# Patient Record
Sex: Female | Born: 1980 | Race: Black or African American | Hispanic: No | Marital: Single | Smoking: Never smoker
Health system: Southern US, Community
[De-identification: ages and names within clinical notes are randomized; demographics above are authoritative.]

## PROBLEM LIST (undated history)

## (undated) ENCOUNTER — Emergency Department (HOSPITAL_COMMUNITY): Admission: EM | Payer: BC Managed Care – PPO | Source: Home / Self Care

## (undated) DIAGNOSIS — T7840XA Allergy, unspecified, initial encounter: Secondary | ICD-10-CM

## (undated) DIAGNOSIS — I10 Essential (primary) hypertension: Secondary | ICD-10-CM

## (undated) DIAGNOSIS — R569 Unspecified convulsions: Secondary | ICD-10-CM

## (undated) DIAGNOSIS — D259 Leiomyoma of uterus, unspecified: Secondary | ICD-10-CM

## (undated) DIAGNOSIS — M5030 Other cervical disc degeneration, unspecified cervical region: Secondary | ICD-10-CM

## (undated) HISTORY — DX: Allergy, unspecified, initial encounter: T78.40XA

## (undated) HISTORY — DX: Unspecified convulsions: R56.9

## (undated) HISTORY — DX: Essential (primary) hypertension: I10

---

## 2003-07-31 HISTORY — PX: APPENDECTOMY: SHX54

## 2007-06-20 ENCOUNTER — Inpatient Hospital Stay (HOSPITAL_COMMUNITY): Admission: AD | Admit: 2007-06-20 | Discharge: 2007-06-20 | Payer: Self-pay | Admitting: Obstetrics and Gynecology

## 2007-07-15 ENCOUNTER — Emergency Department (HOSPITAL_COMMUNITY): Admission: EM | Admit: 2007-07-15 | Discharge: 2007-07-15 | Payer: Self-pay | Admitting: Emergency Medicine

## 2008-10-10 ENCOUNTER — Emergency Department (HOSPITAL_COMMUNITY): Admission: EM | Admit: 2008-10-10 | Discharge: 2008-10-10 | Payer: Self-pay | Admitting: Emergency Medicine

## 2008-12-28 ENCOUNTER — Inpatient Hospital Stay (HOSPITAL_COMMUNITY): Admission: AD | Admit: 2008-12-28 | Discharge: 2008-12-29 | Payer: Self-pay | Admitting: Obstetrics and Gynecology

## 2008-12-29 ENCOUNTER — Inpatient Hospital Stay (HOSPITAL_COMMUNITY): Admission: AD | Admit: 2008-12-29 | Discharge: 2009-01-02 | Payer: Self-pay | Admitting: Obstetrics and Gynecology

## 2010-06-19 ENCOUNTER — Ambulatory Visit (HOSPITAL_BASED_OUTPATIENT_CLINIC_OR_DEPARTMENT_OTHER): Admission: RE | Admit: 2010-06-19 | Discharge: 2010-06-19 | Payer: Self-pay | Admitting: Otolaryngology

## 2010-06-26 ENCOUNTER — Emergency Department (HOSPITAL_COMMUNITY): Admission: EM | Admit: 2010-06-26 | Discharge: 2010-06-27 | Payer: Self-pay | Admitting: Emergency Medicine

## 2010-10-10 LAB — URINALYSIS, ROUTINE W REFLEX MICROSCOPIC
Bilirubin Urine: NEGATIVE
Ketones, ur: NEGATIVE mg/dL
Nitrite: NEGATIVE
Specific Gravity, Urine: 1.018 (ref 1.005–1.030)
Urobilinogen, UA: 0.2 mg/dL (ref 0.0–1.0)

## 2010-10-10 LAB — DIFFERENTIAL
Basophils Absolute: 0 10*3/uL (ref 0.0–0.1)
Eosinophils Relative: 4 % (ref 0–5)
Lymphocytes Relative: 24 % (ref 12–46)
Neutro Abs: 8.2 10*3/uL — ABNORMAL HIGH (ref 1.7–7.7)
Neutrophils Relative %: 62 % (ref 43–77)

## 2010-10-10 LAB — BASIC METABOLIC PANEL
BUN: 7 mg/dL (ref 6–23)
Calcium: 9.3 mg/dL (ref 8.4–10.5)
Creatinine, Ser: 0.66 mg/dL (ref 0.4–1.2)
GFR calc Af Amer: >60 mL/min (ref >60)
GFR calc non Af Amer: >60 mL/min (ref >60)

## 2010-10-10 LAB — CBC
MCV: 95.9 fL (ref 78.0–100.0)
Platelets: 270 10*3/uL (ref 150–400)
RBC: 4.16 MIL/uL (ref 3.87–5.11)
RDW: 12.3 % (ref 11.5–15.5)
WBC: 13.1 10*3/uL — ABNORMAL HIGH (ref 4.0–10.5)

## 2010-10-10 LAB — POCT PREGNANCY, URINE: Preg Test, Ur: NEGATIVE

## 2010-11-06 LAB — URINALYSIS, ROUTINE W REFLEX MICROSCOPIC
Bilirubin Urine: NEGATIVE
Ketones, ur: NEGATIVE mg/dL
Nitrite: NEGATIVE
pH: 6 (ref 5.0–8.0)

## 2010-11-06 LAB — COMPREHENSIVE METABOLIC PANEL
ALT: 11 U/L (ref 0–35)
ALT: 13 U/L (ref 0–35)
AST: 19 U/L (ref 0–37)
Albumin: 2.3 g/dL — ABNORMAL LOW (ref 3.5–5.2)
Albumin: 2.5 g/dL — ABNORMAL LOW (ref 3.5–5.2)
Albumin: 2.7 g/dL — ABNORMAL LOW (ref 3.5–5.2)
Albumin: 2.9 g/dL — ABNORMAL LOW (ref 3.5–5.2)
Alkaline Phosphatase: 102 U/L (ref 39–117)
Alkaline Phosphatase: 106 U/L (ref 39–117)
Alkaline Phosphatase: 108 U/L (ref 39–117)
BUN: 1 mg/dL — ABNORMAL LOW (ref 6–23)
BUN: 5 mg/dL — ABNORMAL LOW (ref 6–23)
BUN: 5 mg/dL — ABNORMAL LOW (ref 6–23)
BUN: 6 mg/dL (ref 6–23)
CO2: 22 mEq/L (ref 19–32)
CO2: 25 mEq/L (ref 19–32)
Chloride: 105 mEq/L (ref 96–112)
Chloride: 107 mEq/L (ref 96–112)
Chloride: 107 mEq/L (ref 96–112)
Chloride: 107 mEq/L (ref 96–112)
Chloride: 108 mEq/L (ref 96–112)
Creatinine, Ser: 0.5 mg/dL (ref 0.4–1.2)
Creatinine, Ser: 0.54 mg/dL (ref 0.4–1.2)
Creatinine, Ser: 0.58 mg/dL (ref 0.4–1.2)
Creatinine, Ser: 0.75 mg/dL (ref 0.4–1.2)
GFR calc Af Amer: >60 mL/min (ref >60)
GFR calc non Af Amer: >60 mL/min (ref >60)
GFR calc non Af Amer: >60 mL/min (ref >60)
GFR calc non Af Amer: >60 mL/min (ref >60)
Glucose, Bld: 73 mg/dL (ref 70–99)
Glucose, Bld: 87 mg/dL (ref 70–99)
Glucose, Bld: 99 mg/dL (ref 70–99)
Potassium: 3.5 mEq/L (ref 3.5–5.1)
Potassium: 3.7 mEq/L (ref 3.5–5.1)
Potassium: 3.9 mEq/L (ref 3.5–5.1)
Potassium: 4 mEq/L (ref 3.5–5.1)
Sodium: 135 mEq/L (ref 135–145)
Sodium: 135 mEq/L (ref 135–145)
Total Bilirubin: 0.4 mg/dL (ref 0.3–1.2)
Total Bilirubin: 0.5 mg/dL (ref 0.3–1.2)
Total Bilirubin: 0.5 mg/dL (ref 0.3–1.2)
Total Bilirubin: 0.5 mg/dL (ref 0.3–1.2)
Total Bilirubin: 0.6 mg/dL (ref 0.3–1.2)
Total Protein: 6.1 g/dL (ref 6.0–8.3)

## 2010-11-06 LAB — CBC
HCT: 34.7 % — ABNORMAL LOW (ref 36.0–46.0)
HCT: 34.8 % — ABNORMAL LOW (ref 36.0–46.0)
HCT: 36.3 % (ref 36.0–46.0)
Hemoglobin: 12.3 g/dL (ref 12.0–15.0)
Hemoglobin: 12.8 g/dL (ref 12.0–15.0)
Hemoglobin: 12.9 g/dL (ref 12.0–15.0)
MCHC: 34.7 g/dL (ref 30.0–36.0)
MCHC: 35 g/dL (ref 30.0–36.0)
MCHC: 35.3 g/dL (ref 30.0–36.0)
MCV: 100.4 fL — ABNORMAL HIGH (ref 78.0–100.0)
MCV: 98.6 fL (ref 78.0–100.0)
MCV: 99.9 fL (ref 78.0–100.0)
Platelets: 158 10*3/uL (ref 150–400)
Platelets: 172 10*3/uL (ref 150–400)
Platelets: 194 10*3/uL (ref 150–400)
RBC: 3.62 MIL/uL — ABNORMAL LOW (ref 3.87–5.11)
RBC: 3.65 MIL/uL — ABNORMAL LOW (ref 3.87–5.11)
RBC: 3.68 MIL/uL — ABNORMAL LOW (ref 3.87–5.11)
WBC: 11.2 10*3/uL — ABNORMAL HIGH (ref 4.0–10.5)
WBC: 12.8 10*3/uL — ABNORMAL HIGH (ref 4.0–10.5)
WBC: 12.8 10*3/uL — ABNORMAL HIGH (ref 4.0–10.5)
WBC: 13.1 10*3/uL — ABNORMAL HIGH (ref 4.0–10.5)
WBC: 13.4 10*3/uL — ABNORMAL HIGH (ref 4.0–10.5)
WBC: 15.9 10*3/uL — ABNORMAL HIGH (ref 4.0–10.5)

## 2010-11-06 LAB — URINE CULTURE
Colony Count: NO GROWTH
Special Requests: NEGATIVE

## 2010-11-06 LAB — PROTEIN, URINE, 24 HOUR
Collection Interval-UPROT: 24 hours
Protein, 24H Urine: 374 mg/d — ABNORMAL HIGH (ref 50–100)
Protein, Urine: 44 mg/dL

## 2010-11-06 LAB — CREATININE CLEARANCE, URINE, 24 HOUR
Creatinine Clearance: 125 mL/min — ABNORMAL HIGH (ref 75–115)
Creatinine, 24H Ur: 1114 mg/d (ref 700–1800)

## 2010-11-06 LAB — URIC ACID
Uric Acid, Serum: 5.1 mg/dL (ref 2.4–7.0)
Uric Acid, Serum: 5.2 mg/dL (ref 2.4–7.0)
Uric Acid, Serum: 5.3 mg/dL (ref 2.4–7.0)
Uric Acid, Serum: 5.5 mg/dL (ref 2.4–7.0)
Uric Acid, Serum: 5.9 mg/dL (ref 2.4–7.0)

## 2010-11-06 LAB — URINE MICROSCOPIC-ADD ON

## 2010-11-06 LAB — LACTATE DEHYDROGENASE: LDH: 97 U/L (ref 94–250)

## 2010-11-06 LAB — RPR: RPR Ser Ql: NONREACTIVE

## 2010-11-06 LAB — MAGNESIUM
Magnesium: 3.1 mg/dL — ABNORMAL HIGH (ref 1.5–2.5)
Magnesium: 5.1 mg/dL — ABNORMAL HIGH (ref 1.5–2.5)

## 2011-05-08 LAB — URINE MICROSCOPIC-ADD ON

## 2011-05-08 LAB — HCG, QUANTITATIVE, PREGNANCY: hCG, Beta Chain, Quant, S: <2

## 2011-05-08 LAB — URINALYSIS, ROUTINE W REFLEX MICROSCOPIC
Leukocytes, UA: NEGATIVE
Nitrite: NEGATIVE
Specific Gravity, Urine: 1.02
pH: 6.5

## 2011-05-08 LAB — WET PREP, GENITAL

## 2011-05-08 LAB — CBC
MCHC: 34.5
MCV: 95.3
Platelets: 267

## 2011-08-19 IMAGING — CT CT ABD-PELV W/ CM
2 of 4 series · 17 of 46 positions shown, 19 images · IV contrast (agent unspecified)
Comparison: None.

CLINICAL DATA: Rectal and back pain.  Elevated white blood cell
count.  Prior appendectomy.

CT ABDOMEN AND PELVIS WITH CONTRAST
TECHNIQUE: Multidetector CT imaging of the abdomen and pelvis was
performed following the standard protocol during bolus
administration of intravenous contrast.
Contrast: 100  ml 0mnipaque-WLL

[Series 2: rtn ap with st · axial · 0.60mm/px · z∈[+652,+1042]mm · 14 of 86 slices shown, 16 images]
[im 4/86  soft-tissue]
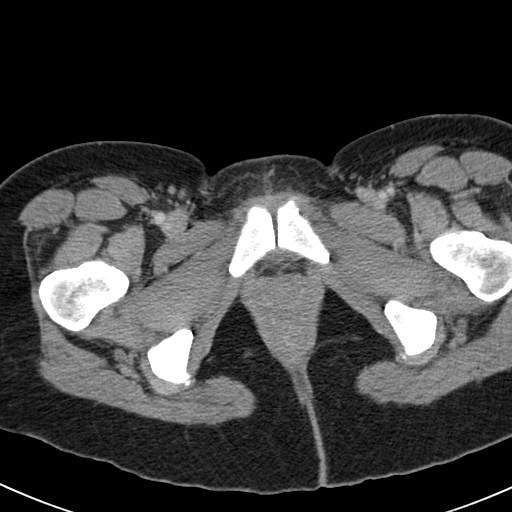
[im 4/86  bone]
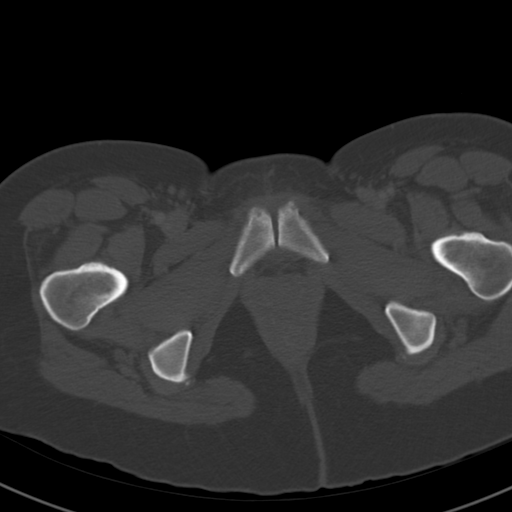
[im 10/86  soft-tissue]
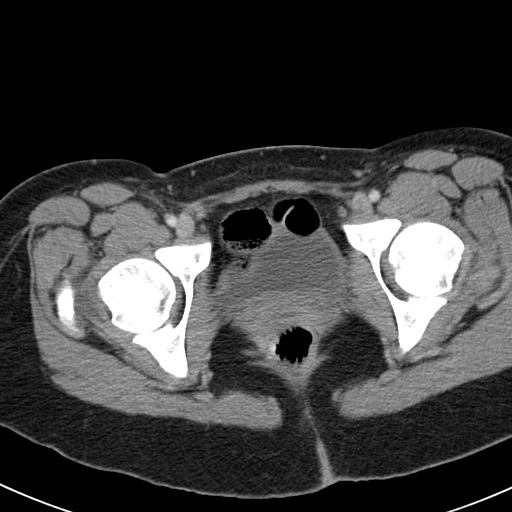
[im 16/86  soft-tissue]
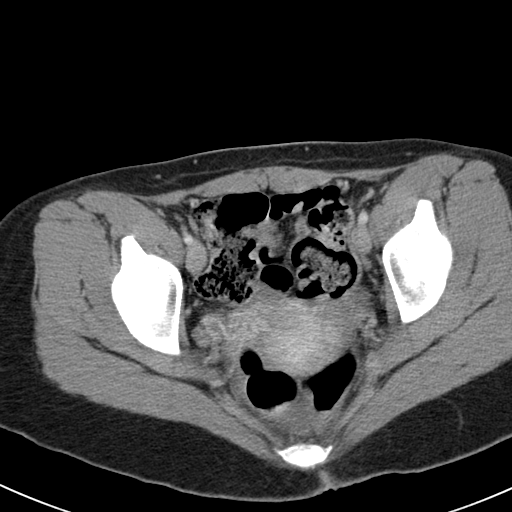
[im 23/86  soft-tissue]
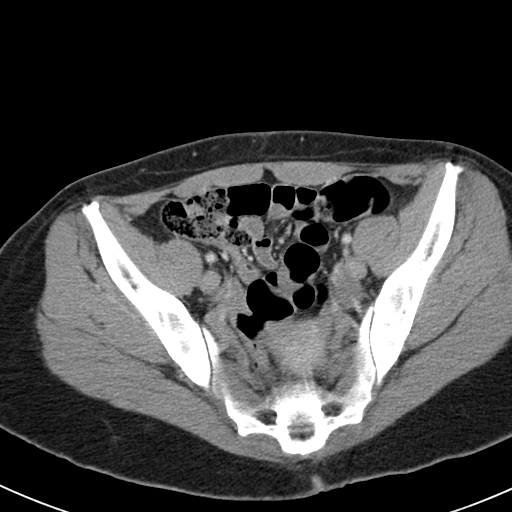
[im 29/86  soft-tissue]
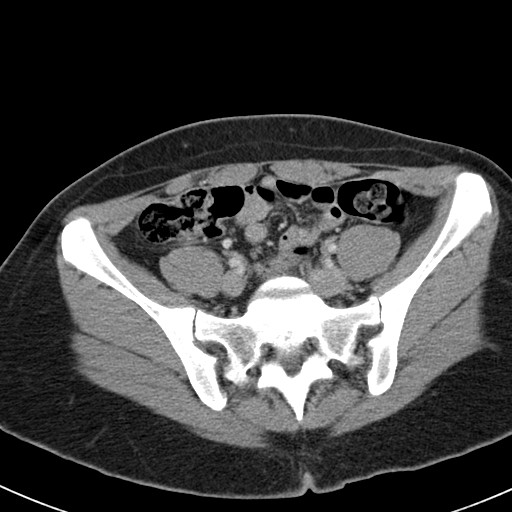
[im 35/86  soft-tissue]
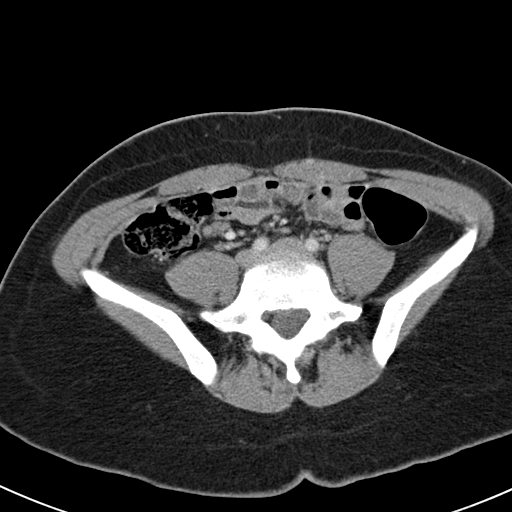
[im 41/86  soft-tissue]
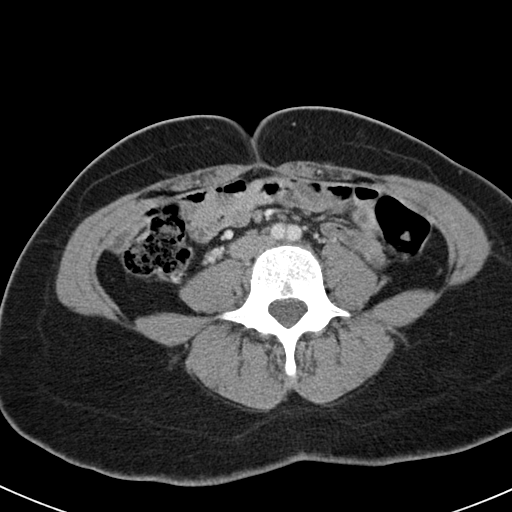
[im 45/86  soft-tissue]
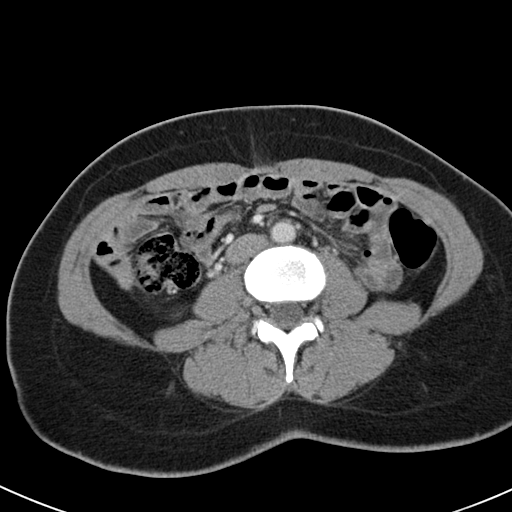
[im 51/86  soft-tissue]
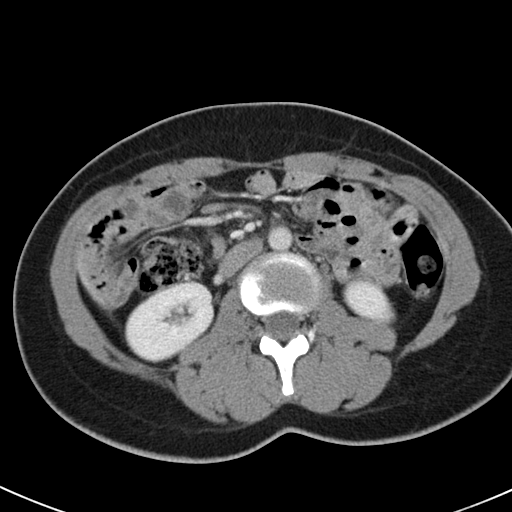
[im 51/86  bone]
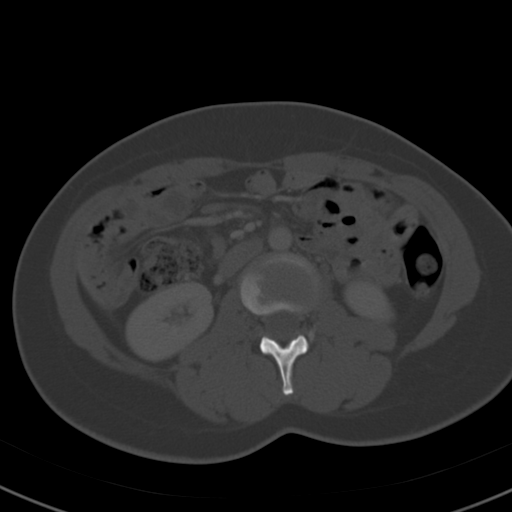
[im 57/86  soft-tissue]
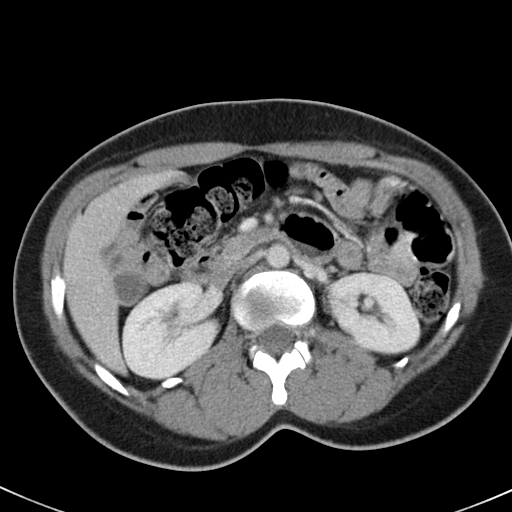
[im 63/86  soft-tissue]
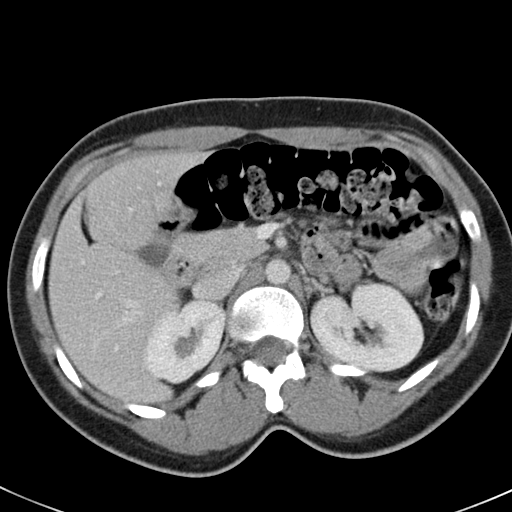
[im 70/86  soft-tissue]
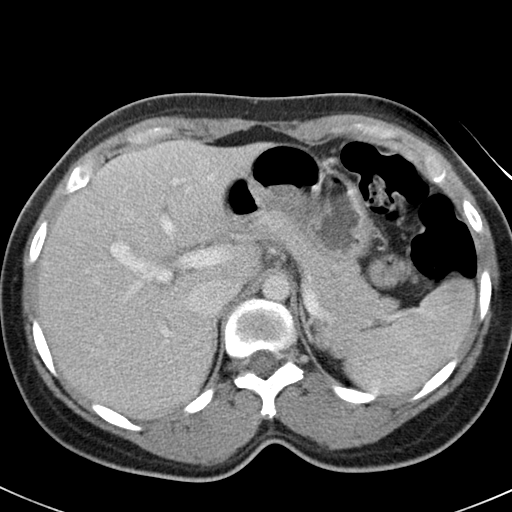
[im 76/86  soft-tissue]
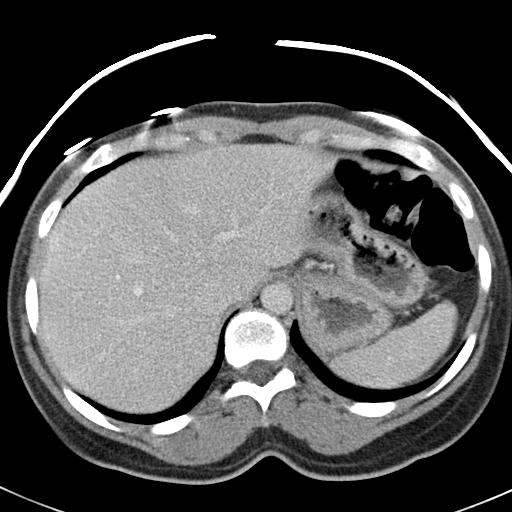
[im 82/86  soft-tissue]
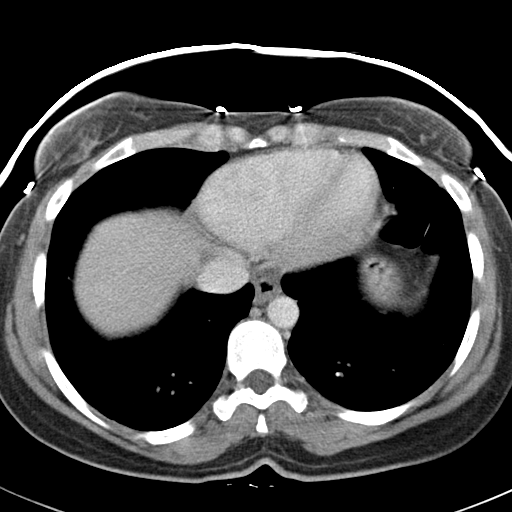

[Series 602: <mpr thick range> · coronal · 0.87mm/px · 3 of 67 slices shown]
[im 23/67  soft-tissue]
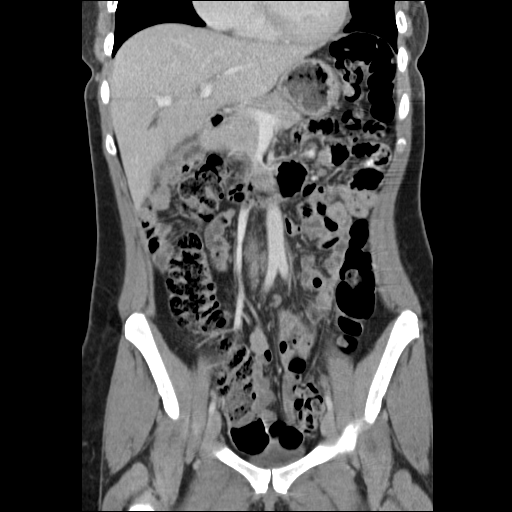
[im 30/67  soft-tissue]
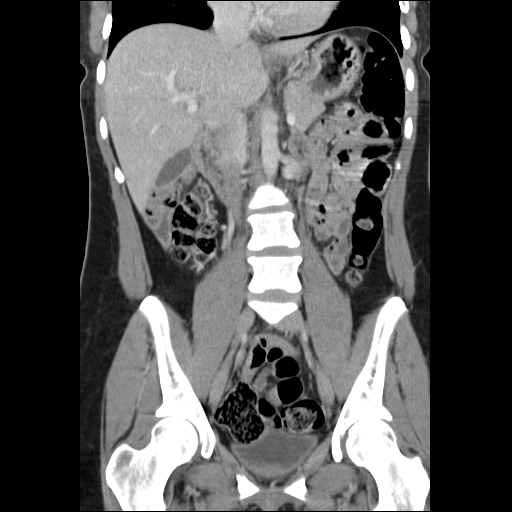
[im 37/67  soft-tissue]
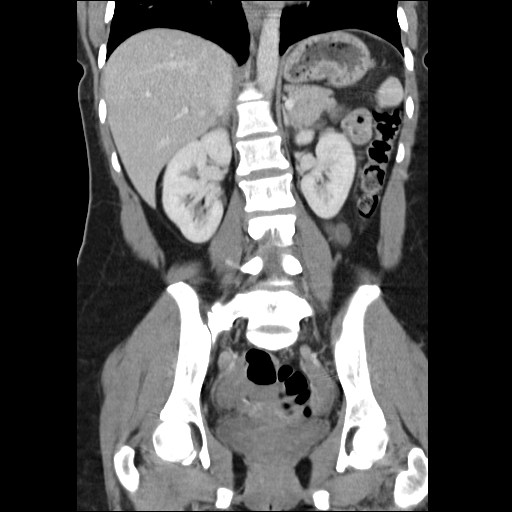

[17 of 46 positions shown; findings below may reference images not displayed]

FINDINGS: Clear lung bases.  Normal heart size without pericardial
or pleural effusion.  Normal liver, spleen, stomach, pancreas,
gallbladder, biliary tract, adrenal glands, left kidney.  Minimal
right-sided caliectasis, including on coronal image 43.  No cause
identified. No retroperitoneal or retrocrural adenopathy.

There is moderate stool within the sigmoid colon.  This could
represent constipation.  Normal terminal ileum.  Appendectomy.
Normal small bowel without abdominal ascites.

  No pelvic adenopathy.  Normal urinary bladder.  Sub cm
peripherally enhancing right ovarian lesion on image 68 could
represent a corpus luteal cyst.  No left ovarian mass. Trace free
pelvic fluid is likely physiologic.  Uterus is retroverted.

No acute osseous abnormality.  Partially sacralized right L5
vertebral body.
IMPRESSION: 1. No acute process in the abdomen or pelvis.
2.  Possible constipation.
3.  Right ovarian corpus luteal cyst suspected.
4.  Minimal right-sided caliectasis without cause identified.

## 2012-06-12 ENCOUNTER — Ambulatory Visit (INDEPENDENT_AMBULATORY_CARE_PROVIDER_SITE_OTHER): Payer: BC Managed Care – PPO | Admitting: Emergency Medicine

## 2012-06-12 VITALS — BP 120/64 | HR 64 | Temp 98.2°F | Resp 18 | Ht 67.0 in | Wt 161.0 lb

## 2012-06-12 DIAGNOSIS — IMO0002 Reserved for concepts with insufficient information to code with codable children: Secondary | ICD-10-CM

## 2012-06-12 DIAGNOSIS — G5680 Other specified mononeuropathies of unspecified upper limb: Secondary | ICD-10-CM

## 2012-06-12 MED ORDER — NAPROXEN SODIUM 550 MG PO TABS: 550.0000 mg | ORAL_TABLET | Freq: Two times a day (BID) | ORAL | Status: DC

## 2012-06-12 MED ORDER — CYCLOBENZAPRINE HCL 10 MG PO TABS: 10.0000 mg | ORAL_TABLET | Freq: Three times a day (TID) | ORAL | Status: DC | PRN

## 2012-06-12 MED ORDER — HYDROCODONE-ACETAMINOPHEN 5-325 MG PO TABS: 1.0000 | ORAL_TABLET | ORAL | Status: AC | PRN

## 2012-06-12 NOTE — Progress Notes (Signed)
[facility] [address] [place] [address] [phone]  Date:  06/12/2012   Name:  Brenda Barker   DOB:  1980/09/17   MRN:  [id number]  PCP:  Serita Kyle, MD    Chief Complaint: Otalgia, Headache and Neck Pain   History of Present Illness:  Brenda Barker is a 31 y.o. very pleasant female patient who presents with the following:  Started with pain in neck and head on Sunday (one week ago).  Now has pain in right side of face and ear.  No fever or chills.  No nasal drainage.  No cough.  No rash or blister  No history of injury or antecedent illness.  No history of prior neck problems.  No neuro or visual symptoms  There is no problem list on file for this patient.   History reviewed. No pertinent past medical history.  Past Surgical History  Procedure Date  . Appendectomy     History  Substance Use Topics  . Smoking status: Never Smoker   . Smokeless tobacco: Not on file  . Alcohol Use: 0.6 oz/week    1 Glasses of wine per week    Family History  Problem Relation Age of Onset  . Asthma Mother     Allergies not on file  Medication list has been reviewed and updated.  No current outpatient prescriptions on file prior to visit.    Review of Systems:  As per HPI, otherwise negative.    Physical Examination: Filed Vitals:   06/12/12 1218  BP: 120/64  Pulse: 64  Temp: 98.2 F (36.8 C)  Resp: 18   Filed Vitals:   06/12/12 1218  Height: 5\' 7"  (1.702 m)  Weight: 161 lb (73.029 kg)   Body mass index is 25.22 kg/(m^2). Ideal Body Weight: Weight in (lb) to have BMI = 25: 159.3   GEN: WDWN, NAD, Non-toxic, A & O x 3  No sepsis or rash.  Won't turn head HEENT: Atraumatic, Normocephalic. Neck supple. No masses, No LAD.  Oropharynx negative Ears and Nose: No external deformity.  TM negative NECK:  Marked tenderness right trapezius with trigger point.  Neuro grossly intact CV: RRR, No M/G/R. No JVD. No thrill. No extra  heart sounds. PULM: CTA B, no wheezes, crackles, rhonchi. No retractions. No resp. distress. No accessory muscle use. ABD: S, NT, ND, +BS. No rebound. No HSM. EXTR: No c/c/e NEURO Normal gait.  PSYCH: Normally interactive. Conversant. Not depressed or anxious appearing.  Calm demeanor.    Assessment and Plan: Scapulocostal syndrome vicodin Anaprox Flexeril Local heat Follow up as needed Offered trigger point injection but declined.  Carmelina Dane, MD

## 2012-06-12 NOTE — Progress Notes (Signed)
Reviewed and agree.

## 2013-01-07 ENCOUNTER — Other Ambulatory Visit: Payer: Self-pay

## 2013-01-07 ENCOUNTER — Ambulatory Visit (INDEPENDENT_AMBULATORY_CARE_PROVIDER_SITE_OTHER): Payer: BC Managed Care – PPO | Admitting: Family Medicine

## 2013-01-07 VITALS — BP 110/81 | HR 79 | Temp 98.4°F | Resp 18 | Wt 167.0 lb

## 2013-01-07 DIAGNOSIS — R05 Cough: Secondary | ICD-10-CM

## 2013-01-07 DIAGNOSIS — J45901 Unspecified asthma with (acute) exacerbation: Secondary | ICD-10-CM

## 2013-01-07 DIAGNOSIS — R059 Cough, unspecified: Secondary | ICD-10-CM

## 2013-01-07 DIAGNOSIS — J4541 Moderate persistent asthma with (acute) exacerbation: Secondary | ICD-10-CM

## 2013-01-07 MED ORDER — ALBUTEROL SULFATE (2.5 MG/3ML) 0.083% IN NEBU: 2.5000 mg | INHALATION_SOLUTION | Freq: Four times a day (QID) | RESPIRATORY_TRACT | Status: DC | PRN

## 2013-01-07 MED ORDER — HYDROCODONE-HOMATROPINE 5-1.5 MG/5ML PO SYRP: 5.0000 mL | ORAL_SOLUTION | ORAL | Status: DC | PRN

## 2013-01-07 MED ORDER — BENZONATATE 100 MG PO CAPS: ORAL_CAPSULE | ORAL | Status: DC

## 2013-01-07 MED ORDER — ALBUTEROL SULFATE (2.5 MG/3ML) 0.083% IN NEBU
2.5000 mg | INHALATION_SOLUTION | Freq: Once | RESPIRATORY_TRACT | Status: AC
  Administered 2013-01-07: 2.5 mg via RESPIRATORY_TRACT

## 2013-01-07 MED ORDER — ALBUTEROL SULFATE HFA 108 (90 BASE) MCG/ACT IN AERS: INHALATION_SPRAY | RESPIRATORY_TRACT | Status: DC

## 2013-01-07 NOTE — Patient Instructions (Signed)
Drink plenty of fluids  Get enough rest  Take the cough syrup when you're not working every 4-6 hours.  Use the cough pills when necessary for coughing in you cannot afford to be too drowsy. They are not as strong as the cough syrup.  Use the inhaler 2 puffs separated by 1-2 minutes every 4-6 hours as needed  If you're not improving by this time tomorrow I may want to prescribe some prednisone for you.

## 2013-01-07 NOTE — Progress Notes (Signed)
Subjective:  32 year old lady who is here with a cough. This began yesterday morning primarily. She only was able to work half a yesterday and again today. She does not smoke. She does not use regular medications .  she has a daughter who has not been ill. She has had a barky incessant cough. She coughs enough her throat is a little sore. She does get lightheaded at times with coughing. Has not been running a fever. Her body aches.  Objective: Healthy appearing lady who looks like she does not feel well. Her TMs are normal. Throat clear. Neck supple without nodes thyromegaly. No carotid bruits. Chest clear to auscultation. On forced expiration she does have a mild end expiratory wheeze and it starts up the barky cough. Her heart was regular without murmurs gallops or arrhythmias.  Assessment:  Asthmatic bronchitis, almost sounds like a child's croup.  Plan: Albuterol nebulizer and then decide treatment. Minimal improvement. Will give her a prescription for albuterol. Also give Tessalon and Hycodan. If she's not doing better by tomorrow I will probably call in some prednisone.

## 2013-07-06 ENCOUNTER — Ambulatory Visit (INDEPENDENT_AMBULATORY_CARE_PROVIDER_SITE_OTHER): Payer: BC Managed Care – PPO | Admitting: Family Medicine

## 2013-07-06 ENCOUNTER — Ambulatory Visit: Payer: BC Managed Care – PPO

## 2013-07-06 VITALS — BP 122/80 | HR 45 | Temp 98.4°F | Resp 12 | Ht 67.0 in | Wt 169.0 lb

## 2013-07-06 DIAGNOSIS — M940 Chondrocostal junction syndrome [Tietze]: Secondary | ICD-10-CM

## 2013-07-06 DIAGNOSIS — R079 Chest pain, unspecified: Secondary | ICD-10-CM

## 2013-07-06 LAB — POCT CBC
Lymph, poc: 3.4 (ref 0.6–3.4)
MCH, POC: 31.3 pg — AB (ref 27–31.2)
MCHC: 30.6 g/dL — AB (ref 31.8–35.4)
MID (cbc): 1 — AB (ref 0–0.9)
MPV: 8.1 fL (ref 0–99.8)
POC MID %: 9.3 %M (ref 0–12)
Platelet Count, POC: 235 10*3/uL (ref 142–424)
RBC: 3.74 M/uL — AB (ref 4.04–5.48)
RDW, POC: 13 %
WBC: 10.6 10*3/uL — AB (ref 4.6–10.2)

## 2013-07-06 MED ORDER — PREDNISONE 20 MG PO TABS: ORAL_TABLET | ORAL | Status: DC

## 2013-07-06 MED ORDER — OMEPRAZOLE 40 MG PO CPDR: 40.0000 mg | DELAYED_RELEASE_CAPSULE | Freq: Every day | ORAL | Status: DC

## 2013-07-06 NOTE — Patient Instructions (Signed)
Continue to use the hydrocodone syrup been having a lot of pain.  Can also continue using the indomethacin if needed for pain. Make sure you take it with some food and do not lie down immediately afterwards. Give it about 30-60 minutes before lying down.  Take the prednisone 3 daily for 2 days, then 2 daily for 2 days, then one daily for 2 days, then one half daily for 4 days. This is best taken with food in the morning  Take omeprazole 1 daily for esophageal irritation by stomach acids.  If worse at any time please return or go to the emergency room. If you're not improving over the next 7-10 days come back for a recheck.

## 2013-07-06 NOTE — Progress Notes (Signed)
Subjective: 32 year old lady who has been having chest pain for the last 3 weeks. She went to a doctor in another town at an urgent care. She was evaluated with an EKG which showed a slow heart rate, but was otherwise normal. She was treated for chest wall pain with some hydrocodone syrup, and indomethacin, and an antibiotic. She does not smoke. She is single, has 2 children. sHe was hurting more this morning when she was getting her daughter ready for school. When she lies down it seems to hurt more. She denies any reflux. When she is real active it sometimes hurts but also.  She is not on any other regular medications. She does have a NuvaRing.  Objective: Pleasant lady in no major distress though she says the pain is about a 7/10. She keeps rubbing her chest wall and around underneath her right breast. Her throat is clear. Neck supple without significant nodes. Chest is clear to auscultation. Chest is painful on palpation of the anterior chest sternal area and around underneath the right breast. The sides of the chest do not seem to be particularly tender. Heart was regular, slightly bradycardic, no murmurs gallops or arrhythmias. Abdomen soft.  Assessment: Probable costochondritis  Plan: Will get a chest x-ray CBC and sedimentation rate.  [facility] reading (PRIMARY) by  Dr. Alwyn Ren Normal chest x-ray  Results for orders placed in visit on 07/06/13  POCT CBC      Result Value Range   WBC 10.6 (*) 4.6 - 10.2 K/uL   Lymph, poc 3.4  0.6 - 3.4   POC LYMPH PERCENT 32.4  10 - 50 %L   MID (cbc) 1.0 (*) 0 - 0.9   POC MID % 9.3  0 - 12 %M   POC Granulocyte 6.2  2 - 6.9   Granulocyte percent 58.3  37 - 80 %G   RBC 3.74 (*) 4.04 - 5.48 M/uL   Hemoglobin 11.7 (*) 12.2 - 16.2 g/dL   HCT, POC 14.7  82.9 - 47.9 %   MCV 102.1 (*) 80 - 97 fL   MCH, POC 31.3 (*) 27 - 31.2 pg   MCHC 30.6 (*) 31.8 - 35.4 g/dL   RDW, POC 56.2     Platelet Count, POC 235  142 - 424 K/uL   MPV 8.1  0 - 99.8 fL   .  This  is most consistent with costochondritis. Will treat with steroids. Will also give her some omeprazole to decrease stomach acid production. She can continue using her hydrocodone if needed and the indomethacin if needed, but cautioned about refluxing the indomethacin.  Return if worse

## 2014-11-13 ENCOUNTER — Emergency Department (HOSPITAL_COMMUNITY): Payer: BC Managed Care – PPO

## 2014-11-13 ENCOUNTER — Emergency Department (HOSPITAL_COMMUNITY)
Admission: EM | Admit: 2014-11-13 | Discharge: 2014-11-14 | Disposition: A | Discharge status: Home / Self Care | Payer: BC Managed Care – PPO | Attending: Emergency Medicine | Admitting: Emergency Medicine

## 2014-11-13 ENCOUNTER — Encounter (HOSPITAL_COMMUNITY): Payer: Self-pay | Admitting: *Deleted

## 2014-11-13 DIAGNOSIS — Y92002 Bathroom of unspecified non-institutional (private) residence single-family (private) house as the place of occurrence of the external cause: Secondary | ICD-10-CM | POA: Diagnosis not present

## 2014-11-13 DIAGNOSIS — Y9339 Activity, other involving climbing, rappelling and jumping off: Secondary | ICD-10-CM | POA: Insufficient documentation

## 2014-11-13 DIAGNOSIS — S8991XA Unspecified injury of right lower leg, initial encounter: Secondary | ICD-10-CM | POA: Diagnosis present

## 2014-11-13 DIAGNOSIS — S86001A Unspecified injury of right Achilles tendon, initial encounter: Secondary | ICD-10-CM | POA: Insufficient documentation

## 2014-11-13 DIAGNOSIS — X58XXXA Exposure to other specified factors, initial encounter: Secondary | ICD-10-CM | POA: Diagnosis not present

## 2014-11-13 DIAGNOSIS — Z79899 Other long term (current) drug therapy: Secondary | ICD-10-CM | POA: Diagnosis not present

## 2014-11-13 DIAGNOSIS — S86811A Strain of other muscle(s) and tendon(s) at lower leg level, right leg, initial encounter: Secondary | ICD-10-CM

## 2014-11-13 DIAGNOSIS — Y998 Other external cause status: Secondary | ICD-10-CM | POA: Insufficient documentation

## 2014-11-13 MED ORDER — ONDANSETRON HCL 4 MG/2ML IJ SOLN
4.0000 mg | Freq: Once | INTRAMUSCULAR | Status: AC
  Administered 2014-11-13: 4 mg via INTRAVENOUS
  Filled 2014-11-13: qty 2

## 2014-11-13 MED ORDER — MORPHINE SULFATE 4 MG/ML IJ SOLN
4.0000 mg | Freq: Once | INTRAMUSCULAR | Status: AC
  Administered 2014-11-13: 4 mg via INTRAVENOUS
  Filled 2014-11-13: qty 1

## 2014-11-13 NOTE — ED Notes (Signed)
Patient transported to X-ray 

## 2014-11-13 NOTE — ED Notes (Signed)
Pt was brought in the ER via EMS s/p fall with Rt knee dislocation; pt was getting up from the toilet and the rug slipped out from underneath patient; pt c/o rt knee pain; rt knee dislocation per EMS; EMS applied splint to rt leg; pt denies neck or back pain; pt received 200mg  Fentanyl en route via EMS

## 2014-11-13 NOTE — ED Provider Notes (Signed)
CSN: [id number]     Arrival date & time 11/13/14  2213 History   First MD Initiated Contact with Patient 11/13/14 2225     Chief Complaint  Patient presents with  . Knee Injury     (Consider location/radiation/quality/duration/timing/severity/associated sxs/prior Treatment) HPI Comments: Patient presents with right knee pain. She was standing up from the toilet and was jumping a little to pull up her pants and the carpet slipped and she jarred her right knee. She had sudden pain and deformity to her right knee. The deformity was on the anterior portion of her knee and she felt like her kneecap dislocated. She denies any pain to the posterior aspect of her knee. There is no other injuries. She did not hit her head or fall. She denies any past injuries to the knee. She was given 200 g of fentanyl in route by EMS but is still having a significant amount of pain.   Past Medical History  Diagnosis Date  . Allergy   . Seizures   . Hypertension     pregnancy induced   Past Surgical History  Procedure Laterality Date  . Appendectomy     Family History  Problem Relation Age of Onset  . Asthma Mother    History  Substance Use Topics  . Smoking status: Never Smoker   . Smokeless tobacco: Not on file  . Alcohol Use: 0.6 oz/week    1 Glasses of wine per week     Comment: occ.   OB History    No data available     Review of Systems  Constitutional: Negative for fever.  Gastrointestinal: Negative for nausea and vomiting.  Musculoskeletal: Positive for joint swelling and arthralgias. Negative for back pain and neck pain.  Skin: Negative for wound.  Neurological: Negative for weakness, numbness and headaches.      Allergies  Review of patient's allergies indicates no known allergies.  Home Medications   Prior to Admission medications   Medication Sig Start Date End Date Taking? Authorizing Provider  albuterol (PROVENTIL HFA;VENTOLIN HFA) 108 (90 BASE) MCG/ACT inhaler Inhale 2  puffs into lungs every 4 - 6 hours as needed for wheezing Patient not taking: Reported on 11/13/2014 01/07/13   Posey Boyer, MD  benzonatate (TESSALON) 100 MG capsule Use 1-2 tablets 3 times daily as necessary for cough. May be used with other cough medicines if needed. Patient not taking: Reported on 11/13/2014 01/07/13   Posey Boyer, MD  HYDROcodone-homatropine [facility]) 5-1.5 MG/5ML syrup Take 5 mLs by mouth every 4 (four) hours as needed for cough. Patient not taking: Reported on 11/13/2014 01/07/13   Posey Boyer, MD  ibuprofen (ADVIL,MOTRIN) 600 MG tablet Take 1 tablet (600 mg total) by mouth every 6 (six) hours as needed. 11/14/14   Malvin Johns, MD  omeprazole (PRILOSEC) 40 MG capsule Take 1 capsule (40 mg total) by mouth daily. Patient not taking: Reported on 11/13/2014 07/06/13   Posey Boyer, MD  oxyCODONE-acetaminophen (PERCOCET) 5-325 MG per tablet Take 1-2 tablets by mouth every 4 (four) hours as needed. 11/14/14   Malvin Johns, MD  predniSONE (DELTASONE) 20 MG tablet Take 3 daily for 2 days, then daily for 2 days, then one daily for 2 days, then one half daily for 4 days for inflammation of chest wall Patient not taking: Reported on 11/13/2014 07/06/13   Posey Boyer, MD   BP 162/93 mmHg  Pulse 57  Temp(Src) 98 F (36.7 C) (Oral)  Resp 16  Ht 5\' 7"  (1.702 m)  Wt 163 lb (73.936 kg)  BMI 25.52 kg/m2  SpO2 100%  LMP 11/10/2014 Physical Exam  Constitutional: She is oriented to person, place, and time. She appears well-developed and well-nourished.  HENT:  Head: Normocephalic and atraumatic.  Neck: Normal range of motion. Neck supple.  Cardiovascular: Normal rate.   Pulmonary/Chest: Effort normal.  Musculoskeletal: She exhibits edema and tenderness.  Patient has positive swelling to the anterior part of the right knee. There is a high riding patella. There is significant tenderness around the anterior portion knee primarily over the patella and the patella tendon. She also  has some pain over the quadriceps tendon. There is no pain to the posterior aspect of the knee. There is no pain to the hip or the ankle. She's unable to do a straight leg raise but I'm unsure whether this is due to pain or disruption of the tendon. She has normal motor function and sensation distally in the foot. She has normal pedal pulses in the foot. There are no wounds.  Neurological: She is alert and oriented to person, place, and time.  Skin: Skin is warm and dry.  Psychiatric: She has a normal mood and affect.    ED Course  Procedures (including critical care time) Labs Review Labs Reviewed - No data to display  Imaging Review Dg Knee Complete 4 Views Right  11/13/2014   CLINICAL DATA:  Fall with right knee pain. Patient slipped on rug, now with right knee pain.  EXAM: RIGHT KNEE - COMPLETE 4+ VIEW  COMPARISON:  07/14/2005  FINDINGS: There is patellar alta well with associated soft tissue prominence anteriorly. Findings suggest patellar tendon injury. Knee alignment is otherwise maintained. There is no fracture. Suspect joint effusion.  IMPRESSION: Patellar alta with anterior soft tissue edema consistent with patellar tendon injury. No acute fracture.   Electronically Signed   By: Jeb Levering M.D.   On: 11/13/2014 23:20     EKG Interpretation None      MDM   Final diagnoses:  Patellar tendon rupture, right, initial encounter    Patient presents with a possible patellar tendon rupture. The [facility] get a great exam due to her significant discomfort but I suspect that she has a patellar tendon rupture. She was placed in knee immobilizer. She was advised to be nonweightbearing. She was advised in ice and elevation. She was given a prescription for ibuprofen and Percocet to use for pain. She was advised to call Monday to follow-up this week with an orthopedist. She was given a referral to Dr. Ronnie Derby.    Malvin Johns, MD 11/14/14 [phone]

## 2014-11-13 NOTE — ED Notes (Signed)
Bed: NG76 Expected date:  Expected time:  Means of arrival:  Comments: EMS Knee dislocation s/p fall

## 2014-11-14 MED ORDER — IBUPROFEN 600 MG PO TABS: 600.0000 mg | ORAL_TABLET | Freq: Four times a day (QID) | ORAL | Status: DC | PRN

## 2014-11-14 MED ORDER — OXYCODONE-ACETAMINOPHEN 5-325 MG PO TABS: 1.0000 | ORAL_TABLET | ORAL | Status: DC | PRN

## 2014-11-14 MED ORDER — OXYCODONE-ACETAMINOPHEN 5-325 MG PO TABS
1.0000 | ORAL_TABLET | Freq: Once | ORAL | Status: AC
  Administered 2014-11-14: 1 via ORAL
  Filled 2014-11-14: qty 1

## 2014-11-14 NOTE — ED Notes (Signed)
Patient medicated for pain prior to DC from ED, see MAR Patient asked this nurse if EDP had also "given me something for nausea?" Patient informed that an order could be obtained for nausea medication---patient declined Upon return to patient room with wheelchair, patient noted to be leaning over the trash can and dry heaving Patient again informed that the EDP can provide her with antiemetic before going home---patient again declined and stated, "No. I will be fine. I just want to go." While assisting patient into wheelchair, patient with c/o feeling as if immobilizer was not tight enough---adjustment made by this nurse prior to DC home

## 2014-11-14 NOTE — ED Notes (Signed)
Discharge instructions reviewed with patient--agrees and verbalized understanding Patient informed of need to make and keep follow up appointment with Ortho Surgery--agrees and verbalized understanding DC Rx reviewed with patient--agrees and verbalized understanding VS updated and stable--reviewed with patient at time of DC--agrees and verbalized understanding Patient alert and oriented x 4 and in NAD at time of discharge All questions related to this ED visit, DC instructions, DC prescriptions and follow up care answered to patient's satisfaction by this nurse

## 2014-11-14 NOTE — ED Notes (Signed)
Patient was given blue scrub pants to pull over knee immobilizer.

## 2014-11-14 NOTE — ED Notes (Signed)
Patient asking for pain medication before she leaves ED Will make EDP aware

## 2014-11-14 NOTE — Discharge Instructions (Signed)
Patellar Tendon Tear / Disruption with Rehab A patellar tendon tear or disruption is a complete tear of the tendon below the kneecap. The patellar tendon attaches the thigh muscle (quadriceps) to the shinbone (tibia). These muscles are responsible for bending the knee and flexing the hip. A tear in the patellar tendon results in a disability to perform these actions. SYMPTOMS   A "pop" or tear felt in the knee or under the kneecap, at the time of injury.  Pain, tenderness, swelling, warmth, or redness over and around the patellar tendon.  Pain that gets worse when trying to forcefully straighten the knee or bend the knee.  Inability to straighten the knee when seated.  Crackling sound (crepitation) when the tendon is moved or touched.  Bruising (contusion) around the knee within 48 hours of injury.  Loss of firm fullness when pushing on the area where the tendon ruptured (a defect between the ends of the tendon where they separated from each other). CAUSES  The patellar tendon tears when a force is placed on it that is greater than it can handle. Common causes of injury include:  A stressful incident, such as with jumping, hurdling, or starting a sprint.  Direct hit (trauma) to the knee. RISK INCREASES WITH:  Sports that require sudden, explosive muscle contraction, such as those involving jumping or quick starts.  Running or contact sports.  Poor strength and flexibility.  Previous patellar tendon injury.  Untreated patellar tendinitis.  Corticosteroid injection into the patellar tendon. (Corticosteroid injections weaken tendons.) PREVENTION  Warm up and stretch properly before activity.  Allow for adequate recovery between workouts.  Maintain physical fitness:  Strength, flexibility, and endurance.  Cardiovascular fitness.  Protect the knee with taping, protective strapping, or elastic compression bandage during activity. PROGNOSIS  If treated properly, patellar  tendon tears usually heal, with a return to sports within 6 to 9 months after injury.  RELATED COMPLICATIONS   Weakness of the thigh (quadriceps) muscles, especially if the tear is left untreated.  Re-rupture of the tendon after treatment.  Prolonged disability.  Risks of surgery: infection, injury to nerves (numbness, weakness, or paralysis), bleeding, knee stiffness, knee weakness, pain when sitting for long periods, pain when getting up from a seated position and when kneeling or squatting, pain going up or down stairs or hills, and knee giving way or buckling. TREATMENT  Treatment first involves resting from any activities that aggravate the symptoms. The use of ice and medicine will help reduce pain and inflammation. Applying a compression bandage and elevating the knee above the level of the heart will also help reduce inflammation. Definitive treatment for patellar tendon tears is surgery, because contraction of the quadriceps tendon prevents healing of the tendon. Surgery often involves using stitches (sutures) to sew the ends of the tendon back together. Surgery is followed by restraint of the knee, to allow for healing. After restraint, it is important to perform strengthening and stretching exercises to help regain strength and a full range of motion. These exercises may be completed at home or with a therapist.  MEDICATION   If pain medicine is needed, nonsteroidal anti-inflammatory medicines (aspirin and ibuprofen), or other minor pain relievers (acetaminophen), are often advised.  Do not take pain medicine for 7 days before surgery.  Prescription pain relievers may be given, if your caregiver thinks they are needed. Use only as directed and only as much as you need. COLD THERAPY  Cold treatment (icing) should be applied for 10 to 15  minutes every 2 to 3 hours for inflammation and pain, and immediately after activity that aggravates your symptoms. Use ice packs or an ice  massage. SEEK MEDICAL CARE IF:  Pain increases, despite treatment.  Cast discomfort develops.  Any of the following occur after surgery: signs of infection, including fever, increased pain, swelling, redness, drainage of fluids, or bleeding in the affected area.  New, unexplained symptoms develop. (Drugs used in treatment may produce side effects.)

## 2014-11-14 NOTE — ED Notes (Signed)
Patient instructed on how to use crutches. Patient gave a return demonstration showing that she understands how to use the crutches.

## 2016-07-30 DIAGNOSIS — M5030 Other cervical disc degeneration, unspecified cervical region: Secondary | ICD-10-CM

## 2016-07-30 HISTORY — DX: Other cervical disc degeneration, unspecified cervical region: M50.30

## 2017-05-22 ENCOUNTER — Ambulatory Visit (INDEPENDENT_AMBULATORY_CARE_PROVIDER_SITE_OTHER): Payer: BC Managed Care – PPO | Admitting: Physician Assistant

## 2017-05-22 ENCOUNTER — Encounter: Payer: Self-pay | Admitting: Physician Assistant

## 2017-05-22 ENCOUNTER — Ambulatory Visit (INDEPENDENT_AMBULATORY_CARE_PROVIDER_SITE_OTHER): Payer: BC Managed Care – PPO

## 2017-05-22 VITALS — BP 130/80 | HR 57 | Temp 98.7°F | Resp 18 | Ht 67.0 in | Wt 175.6 lb

## 2017-05-22 DIAGNOSIS — M545 Low back pain, unspecified: Secondary | ICD-10-CM

## 2017-05-22 DIAGNOSIS — M25512 Pain in left shoulder: Secondary | ICD-10-CM

## 2017-05-22 DIAGNOSIS — M542 Cervicalgia: Secondary | ICD-10-CM | POA: Diagnosis not present

## 2017-05-22 DIAGNOSIS — R1031 Right lower quadrant pain: Secondary | ICD-10-CM

## 2017-05-22 DIAGNOSIS — N3000 Acute cystitis without hematuria: Secondary | ICD-10-CM

## 2017-05-22 LAB — POCT URINALYSIS DIP (MANUAL ENTRY)
Bilirubin, UA: NEGATIVE
Glucose, UA: NEGATIVE mg/dL
Ketones, POC UA: NEGATIVE mg/dL
NITRITE UA: POSITIVE — AB
PROTEIN UA: NEGATIVE mg/dL
Spec Grav, UA: 1.02 (ref 1.010–1.025)
Urobilinogen, UA: 0.2 E.U./dL
pH, UA: 6 (ref 5.0–8.0)

## 2017-05-22 MED ORDER — CYCLOBENZAPRINE HCL 10 MG PO TABS: 10.0000 mg | ORAL_TABLET | Freq: Three times a day (TID) | ORAL | 0 refills | Status: DC | PRN

## 2017-05-22 MED ORDER — MELOXICAM 15 MG PO TABS: 15.0000 mg | ORAL_TABLET | Freq: Every day | ORAL | 1 refills | Status: DC

## 2017-05-22 NOTE — Patient Instructions (Addendum)
Do not use meloxicam AND the Aleve. You CAN take acetaminophen with either of these medications. The cyclobenzaprine causes drowsiness. Do not take it and drive. You may need to reserve it for bedtime.    IF you received an x-ray today, you will receive an invoice from [facility]. Please contact [facility] at [phone] with questions or concerns regarding your invoice.   IF you received labwork today, you will receive an invoice from [facility]. Please contact [facility] at [phone] with questions or concerns regarding your invoice.   Our billing staff will not be able to assist you with questions regarding bills from these companies.  You will be contacted with the lab results as soon as they are available. The fastest way to get your results is to activate your My Chart account. Instructions are located on the last page of this paperwork. If you have not heard from [place] regarding the results in 2 weeks, please contact this office.
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
## 2017-05-23 LAB — URINALYSIS, MICROSCOPIC ONLY
CASTS: NONE SEEN /LPF
WBC, UA: >30 /hpf — AB (ref 0–?)

## 2017-05-24 LAB — URINE CULTURE

## 2017-05-24 MED ORDER — SULFAMETHOXAZOLE-TRIMETHOPRIM 800-160 MG PO TABS: 1.0000 | ORAL_TABLET | Freq: Two times a day (BID) | ORAL | 0 refills | Status: DC

## 2017-06-02 ENCOUNTER — Encounter: Payer: Self-pay | Admitting: Physician Assistant

## 2017-07-17 ENCOUNTER — Encounter: Payer: Self-pay | Admitting: Physician Assistant

## 2017-07-17 ENCOUNTER — Ambulatory Visit: Payer: BC Managed Care – PPO | Admitting: Physician Assistant

## 2017-07-17 VITALS — BP 122/72 | HR 66 | Temp 97.9°F | Resp 17 | Ht 67.5 in | Wt 179.0 lb

## 2017-07-17 DIAGNOSIS — B9689 Other specified bacterial agents as the cause of diseases classified elsewhere: Secondary | ICD-10-CM

## 2017-07-17 DIAGNOSIS — N760 Acute vaginitis: Secondary | ICD-10-CM | POA: Diagnosis not present

## 2017-07-17 DIAGNOSIS — N898 Other specified noninflammatory disorders of vagina: Secondary | ICD-10-CM | POA: Diagnosis not present

## 2017-07-17 DIAGNOSIS — Z202 Contact with and (suspected) exposure to infections with a predominantly sexual mode of transmission: Secondary | ICD-10-CM | POA: Diagnosis not present

## 2017-07-17 LAB — POCT WET + KOH PREP
Trich by wet prep: ABSENT
Yeast by KOH: ABSENT
Yeast by wet prep: ABSENT

## 2017-07-17 LAB — POC MICROSCOPIC URINALYSIS (UMFC): Mucus: ABSENT

## 2017-07-17 LAB — POCT URINALYSIS DIP (MANUAL ENTRY)
Bilirubin, UA: NEGATIVE
Glucose, UA: NEGATIVE mg/dL
Ketones, POC UA: NEGATIVE mg/dL
Nitrite, UA: NEGATIVE
Protein Ur, POC: NEGATIVE mg/dL
Spec Grav, UA: 1.02 (ref 1.010–1.025)
Urobilinogen, UA: 0.2 E.U./dL
pH, UA: 7 (ref 5.0–8.0)

## 2017-07-17 MED ORDER — METRONIDAZOLE 500 MG PO TABS: 500.0000 mg | ORAL_TABLET | Freq: Two times a day (BID) | ORAL | 0 refills | Status: DC

## 2017-07-17 NOTE — Patient Instructions (Addendum)
You are positive for BV. Start taking Flagyl. DO NOT DRINK ALCOHOL WHILE TAKING THIS MEDICATION. You will get sick.  We will contact you with the rest of your results when they come back.   Thank you for coming in today. I hope you feel we met your needs.  Feel free to call PCP if you have any questions or further requests.  Please consider signing up for MyChart if you do not already have it, as this is a great way to communicate with me.  Best,  Whitney McVey, PA-C    Bacterial Vaginosis Bacterial vaginosis is a vaginal infection that occurs when the normal balance of bacteria in the vagina is disrupted. It results from an overgrowth of certain bacteria. This is the most common vaginal infection among women ages 86-44. Because bacterial vaginosis increases your risk for STIs (sexually transmitted infections), getting treated can help reduce your risk for chlamydia, gonorrhea, herpes, and HIV (human immunodeficiency virus). Treatment is also important for preventing complications in pregnant women, because this condition can cause an early (premature) delivery. What are the causes? This condition is caused by an increase in harmful bacteria that are normally present in small amounts in the vagina. However, the reason that the condition develops is not fully understood. What increases the risk? The following factors may make you more likely to develop this condition:  Having a new sexual partner or multiple sexual partners.  Having unprotected sex.  Douching.  Having an intrauterine device (IUD).  Smoking.  Drug and alcohol abuse.  Taking certain antibiotic medicines.  Being pregnant.  You cannot get bacterial vaginosis from toilet seats, bedding, swimming pools, or contact with objects around you. What are the signs or symptoms? Symptoms of this condition include:  Grey or white vaginal discharge. The discharge can also be watery or foamy.  A fish-like odor with discharge,  especially after sexual intercourse or during menstruation.  Itching in and around the vagina.  Burning or pain with urination.  Some women with bacterial vaginosis have no signs or symptoms. How is this diagnosed? This condition is diagnosed based on:  Your medical history.  A physical exam of the vagina.  Testing a sample of vaginal fluid under a microscope to look for a large amount of bad bacteria or abnormal cells. Your health care provider may use a cotton swab or a small wooden spatula to collect the sample.  How is this treated? This condition is treated with antibiotics. These may be given as a pill, a vaginal cream, or a medicine that is put into the vagina (suppository). If the condition comes back after treatment, a second round of antibiotics may be needed. Follow these instructions at home: Medicines  Take over-the-counter and prescription medicines only as told by your health care provider.  Take or use your antibiotic as told by your health care provider. Do not stop taking or using the antibiotic even if you start to feel better. General instructions  If you have a female sexual partner, tell her that you have a vaginal infection. She should see her health care provider and be treated if she has symptoms. If you have a female sexual partner, he does not need treatment.  During treatment: ? Avoid sexual activity until you finish treatment. ? Do not douche. ? Avoid alcohol as directed by your health care provider. ? Avoid breastfeeding as directed by your health care provider.  Drink enough water and fluids to keep your urine clear or pale  yellow.  Keep the area around your vagina and rectum clean. ? Wash the area daily with warm water. ? Wipe yourself from front to back after using the toilet.  Keep all follow-up visits as told by your health care provider. This is important. How is this prevented?  Do not douche.  Wash the outside of your vagina with warm  water only.  Use protection when having sex. This includes latex condoms and dental dams.  Limit how many sexual partners you have. To help prevent bacterial vaginosis, it is best to have sex with just one partner (monogamous).  Make sure you and your sexual partner are tested for STIs.  Wear cotton or cotton-lined underwear.  Avoid wearing tight pants and pantyhose, especially during summer.  Limit the amount of alcohol that you drink.  Do not use any products that contain nicotine or tobacco, such as cigarettes and e-cigarettes. If you need help quitting, ask your health care provider.  Do not use illegal drugs. Where to find more information:  Centers for Disease Control and Prevention: AppraiserFraud.fi  American Sexual Health Association (ASHA): www.ashastd.org  U.S. Department of Health and Financial controller, Office on Women's Health: DustingSprays.pl or SecuritiesCard.it Contact a health care provider if:  Your symptoms do not improve, even after treatment.  You have more discharge or pain when urinating.  You have a fever.  You have pain in your abdomen.  You have pain during sex.  You have vaginal bleeding between periods. Summary  Bacterial vaginosis is a vaginal infection that occurs when the normal balance of bacteria in the vagina is disrupted.  Because bacterial vaginosis increases your risk for STIs (sexually transmitted infections), getting treated can help reduce your risk for chlamydia, gonorrhea, herpes, and HIV (human immunodeficiency virus). Treatment is also important for preventing complications in pregnant women, because the condition can cause an early (premature) delivery.  This condition is treated with antibiotic medicines. These may be given as a pill, a vaginal cream, or a medicine that is put into the vagina (suppository). This information is not intended to replace advice given to you by your health  care provider. Make sure you discuss any questions you have with your health care provider. Document Released: 07/16/2005 Document Revised: 11/19/2016 Document Reviewed: 03/31/2016 Elsevier Interactive Patient Education  2018 [employer].  IF you received an x-ray today, you will receive an invoice from [facility]. Please contact [facility] at [phone] with questions or concerns regarding your invoice.   IF you received labwork today, you will receive an invoice from [facility]. Please contact [facility] at [phone] with questions or concerns regarding your invoice.   Our billing staff will not be able to assist you with questions regarding bills from these companies.  You will be contacted with the lab results as soon as they are available. The fastest way to get your results is to activate your My Chart account. Instructions are located on the last page of this paperwork. If you have not heard from [place] regarding the results in 2 weeks, please contact this office.

## 2017-07-17 NOTE — Progress Notes (Signed)
Brenda Barker  MRN: [id number] DOB: Dec 13, 1980  PCP: Servando Salina, MD  Subjective:  Pt is a 36 year old female who presents to clinic for vaginal discharge x 1 week. Discharge is "a little bit" "almost brownish color", +odor, mild itching. Denies pain, urinary symptoms, back pain, abdominal pain, fever, chills.   Last coitus 4 weeks ago. She took a plan B.  Antibiotic for UTI about one month ago.   Review of Systems  Constitutional: Negative for chills, fatigue and fever.  Respiratory: Negative for cough.   Gastrointestinal: Negative for abdominal pain.  Genitourinary: Positive for vaginal discharge. Negative for decreased urine volume, difficulty urinating, dysuria, enuresis, flank pain, frequency, hematuria, menstrual problem, pelvic pain, urgency, vaginal bleeding and vaginal pain.  Musculoskeletal: Negative for back pain.    There are no active problems to display for this patient.   No current outpatient medications on file prior to visit.   No current facility-administered medications on file prior to visit.     No Known Allergies   Objective:  BP 122/72   Pulse 66   Temp 97.9 F (36.6 C) (Oral)   Resp 17   Ht 5' 7.5" (1.715 m)   Wt 179 lb (81.2 kg)   LMP 06/12/2017 (Approximate)   SpO2 98%   BMI 27.62 kg/m   Physical Exam  Constitutional: She is oriented to person, place, and time and well-developed, well-nourished, and in no distress. No distress.  Cardiovascular: Normal rate, regular rhythm and normal heart sounds.  Abdominal: There is no tenderness. There is no CVA tenderness.  Genitourinary: Cervix normal, right adnexa normal and left adnexa normal. Thin  white and vaginal discharge found.  Neurological: She is alert and oriented to person, place, and time. GCS score is 15.  Skin: Skin is warm and dry.  Psychiatric: Mood, memory, affect and judgment normal.  Vitals reviewed.  Results for orders placed or performed in visit on 07/17/17  POCT  urinalysis dipstick  Result Value Ref Range   Color, UA yellow yellow   Clarity, UA clear clear   Glucose, UA negative negative mg/dL   Bilirubin, UA negative negative   Ketones, POC UA negative negative mg/dL   Spec Grav, UA 1.020 1.010 - 1.025   Blood, UA trace-intact (A) negative   pH, UA 7.0 5.0 - 8.0   Protein Ur, POC negative negative mg/dL   Urobilinogen, UA 0.2 0.2 or 1.0 E.U./dL   Nitrite, UA Negative Negative   Leukocytes, UA Trace (A) Negative  POCT Microscopic Urinalysis ([facility])  Result Value Ref Range   WBC,UR,HPF,POC Few (A) None WBC/hpf   RBC,UR,HPF,POC None None RBC/hpf   Bacteria Many (A) None, Too numerous to count   Mucus Absent Absent   Epithelial Cells, UR Per Microscopy Few (A) None, Too numerous to count cells/hpf  POCT Wet + KOH Prep  Result Value Ref Range   Yeast by KOH Absent Absent   Yeast by wet prep Absent Absent   WBC by wet prep Moderate (A) Few   Clue Cells Wet Prep HPF POC Many (A) None   Trich by wet prep Absent Absent   Bacteria Wet Prep HPF POC Too numerous to count  (A) Few   Epithelial Cells By [employer] Pref ([facility]) None None, Few, Too numerous to count   RBC,UR,HPF,POC Too numerous to count  (A) None RBC/hpf    Assessment and Plan :  1. BV (bacterial vaginosis) 2. Vaginal discharge 3. Possible exposure to STD - Urine  Culture - POCT urinalysis dipstick - POCT Microscopic Urinalysis ([facility]) - POCT Wet + KOH Prep - Chlamydia/Gonococcus/Trichomonas, NAA - HIV antibody - RPR - Pt c/o vaginal discharge x 1 week. KOH +clue cells. Plan to treat for BV. Other labs are pending. Will contact with results.   Mercer Pod, PA-C  [facility] 07/17/2017 3:47 PM

## 2017-07-18 LAB — URINE CULTURE

## 2017-07-18 LAB — RPR: RPR Ser Ql: NONREACTIVE

## 2017-07-18 LAB — HIV ANTIBODY (ROUTINE TESTING W REFLEX): HIV Screen 4th Generation wRfx: NONREACTIVE

## 2017-07-19 LAB — CHLAMYDIA/GONOCOCCUS/TRICHOMONAS, NAA
Chlamydia by NAA: NEGATIVE
Gonococcus by NAA: NEGATIVE
Trich vag by NAA: NEGATIVE

## 2017-07-20 ENCOUNTER — Encounter: Payer: Self-pay | Admitting: Physician Assistant

## 2017-07-20 NOTE — Progress Notes (Signed)
Please call pt and let her know she is negative for gonorrhea, chlamydia, trichomonas, syphilis and HIV. Thank you!

## 2017-07-30 DIAGNOSIS — D259 Leiomyoma of uterus, unspecified: Secondary | ICD-10-CM

## 2017-07-30 HISTORY — DX: Leiomyoma of uterus, unspecified: D25.9

## 2017-08-12 ENCOUNTER — Ambulatory Visit: Payer: Self-pay

## 2017-08-12 NOTE — Telephone Encounter (Signed)
Patient called in with "heavy vaginal bleeding." She states "I started my period yesterday and it was a normal flow. Today at work I changed my pad every 2 hours from about 12-3p and the blood went through my pants once. I had on a tampon too." I asked was she passing clots, she said "yes, large bloody clots." She reports taking the Plan B pill in November, but is not on birth control or has had any gyn procedures. She says her cramping is moderate, rated a 6-7. She says "my last period in December was not like this, so that's why I called to make sure it wasn't anything." I asked was she having any other symptoms, she said "light lightheadedness, but I can walk around and I drove today. I have the bleeding under control now." Protocol indicates home care advice, advice given, advised to monitor her bleeding and if it gets worse like earlier and/or if she becomes more lightheaded to go to ED, patient verbalized understanding.   Answer Assessment - Initial Assessment Questions 1. AMOUNT: "Describe the bleeding that you are having."    - SPOTTING: spotting, or pinkish / brownish mucous discharge; does not fill panti-liner or pad    - MILD:  less than 1 pad / hour; less than patient's usual menstrual bleeding   - MODERATE: 1-2 pads / hour; small-medium blood clots (e.g., pea, grape, small coin)    - SEVERE: soaking 2 or more pads/hour for 2 or more hours; bleeding not contained by pads or continuous red blood from vagina; large blood clots (e.g., golf ball, large coin)      Severe 2. ONSET: "When did the bleeding begin?" "Is it continuing now?"     Yesterday 3. MENSTRUAL PERIOD: "When was the last normal menstrual period?" "How is this different than your period?"     Mid December, yes different because heavy bleeding  4. REGULARITY: "How regular are your periods?"     Yes 5. ABDOMINAL PAIN: "Do you have any pain?" "How bad is the pain?"  (e.g., Scale 1-10; mild, moderate, or severe)   - MILD (1-3):  doesn't interfere with normal activities, abdomen soft and not tender to touch    - MODERATE (4-7): interferes with normal activities or awakens from sleep, tender to touch    - SEVERE (8-10): excruciating pain, doubled over, unable to do any normal activities      Moderate 6. PREGNANCY: "Could you be pregnant?" "Are you sexually active?" "Did you recently give birth?"    No 7. BREASTFEEDING: "Are you breastfeeding?"     N/A 8. HORMONES: "Are you taking any hormone medications, prescription or OTC?" (e.g., birth control pills, estrogen)     Plan B pill end of November 9. BLOOD THINNERS: "Do you take any blood thinners?" (e.g., Coumadin/warfarin, Pradaxa/dabigatran, aspirin)     No 10. CAUSE: "What do you think is causing the bleeding?" (e.g., recent gyn surgery, recent gyn procedure; known bleeding disorder, cervical cancer, polycystic ovarian disease, fibroids)         Unknown 11. HEMODYNAMIC STATUS: "Are you weak or feeling lightheaded?" If so, ask: "Can you stand and walk normally?"        Little lightheaded, can stand and walk normally 12. OTHER SYMPTOMS: "What other symptoms are you having with the bleeding?" (e.g., passed tissue, vaginal discharge, fever, menstrual-type cramps)       None known  Protocols used: VAGINAL BLEEDING - ABNORMAL-A-AH

## 2018-01-06 ENCOUNTER — Other Ambulatory Visit: Payer: Self-pay | Admitting: Obstetrics and Gynecology

## 2018-01-06 DIAGNOSIS — R102 Pelvic and perineal pain: Secondary | ICD-10-CM

## 2018-01-09 ENCOUNTER — Ambulatory Visit
Admission: RE | Admit: 2018-01-09 | Discharge: 2018-01-09 | Disposition: A | Discharge status: Home / Self Care | Payer: BC Managed Care – PPO | Source: Ambulatory Visit | Attending: Obstetrics and Gynecology | Admitting: Obstetrics and Gynecology

## 2018-01-09 DIAGNOSIS — R102 Pelvic and perineal pain: Secondary | ICD-10-CM

## 2018-10-01 ENCOUNTER — Other Ambulatory Visit: Payer: Self-pay | Admitting: Obstetrics and Gynecology

## 2018-10-08 ENCOUNTER — Other Ambulatory Visit: Payer: Self-pay | Admitting: Obstetrics and Gynecology

## 2018-10-08 DIAGNOSIS — R928 Other abnormal and inconclusive findings on diagnostic imaging of breast: Secondary | ICD-10-CM

## 2018-10-15 ENCOUNTER — Encounter (HOSPITAL_BASED_OUTPATIENT_CLINIC_OR_DEPARTMENT_OTHER): Payer: Self-pay

## 2018-10-17 ENCOUNTER — Encounter (HOSPITAL_COMMUNITY): Admission: RE | Admit: 2018-10-17 | Payer: BC Managed Care – PPO | Source: Ambulatory Visit

## 2018-10-23 ENCOUNTER — Other Ambulatory Visit: Payer: BC Managed Care – PPO

## 2018-10-24 ENCOUNTER — Other Ambulatory Visit: Payer: BC Managed Care – PPO

## 2018-11-12 ENCOUNTER — Encounter (HOSPITAL_COMMUNITY): Payer: BC Managed Care – PPO

## 2018-11-13 ENCOUNTER — Other Ambulatory Visit: Payer: BC Managed Care – PPO

## 2018-11-17 ENCOUNTER — Encounter (HOSPITAL_BASED_OUTPATIENT_CLINIC_OR_DEPARTMENT_OTHER): Admission: RE | Payer: Self-pay | Source: Home / Self Care

## 2018-11-17 ENCOUNTER — Ambulatory Visit (HOSPITAL_BASED_OUTPATIENT_CLINIC_OR_DEPARTMENT_OTHER)
Admission: RE | Admit: 2018-11-17 | Payer: BC Managed Care – PPO | Source: Home / Self Care | Admitting: Obstetrics and Gynecology

## 2018-11-17 HISTORY — DX: Other cervical disc degeneration, unspecified cervical region: M50.30

## 2018-11-17 HISTORY — DX: Leiomyoma of uterus, unspecified: D25.9

## 2018-11-17 SURGERY — XI ROBOTIC ASSISTED LAPAROSCOPIC HYSTERECTOMY AND SALPINGECTOMY
Anesthesia: General | Laterality: Bilateral

## 2018-11-21 ENCOUNTER — Other Ambulatory Visit: Payer: BC Managed Care – PPO

## 2018-12-24 ENCOUNTER — Other Ambulatory Visit: Payer: Self-pay | Admitting: Obstetrics and Gynecology

## 2018-12-29 NOTE — Patient Instructions (Addendum)
Your procedure is scheduled on   Monday 01/05/2019   Report to [facility] 19 TEST IS SCHEDULED FOR 01-01-19 @ 3pm. ONCE YOU HAVE COMPLETED THE COVID 19 TEST, PLEASE BEGIN THE QUARANTINE GUIDELINES INSTRUCTIONS PER YOUR HANDOUT   Call this number if you have problems the morning of surgery  :[phone].   OUR ADDRESS IS [place].  WE ARE LOCATED IN [facility].                                     REMEMBER:  DO NOT EAT FOOD  AFTER MIDNIGHT . May have clear liquids from midnight up until 0700 am, then nothing until after surgery.     CLEAR LIQUID DIET   Foods Allowed                                                                     Foods Excluded  Coffee and tea, regular and decaf                             liquids that you cannot  Plain Jell-O in any flavor                                             see through such as: Fruit ices (not with fruit pulp)                                     milk, soups, orange juice  Iced Popsicles                                    All solid food Carbonated beverages, regular and diet                                    Cranberry, grape and apple juices Sports drinks like Gatorade Lightly seasoned clear broth or consume(fat free) Sugar, honey syrup  Sample Menu Breakfast                                Lunch                                     Supper Cranberry juice                    Beef broth                            Chicken broth Jell-O  Grape juice                           Apple juice Coffee or tea                        Jell-O                                      Popsicle                                                Coffee or tea                        Coffee or tea  _____________________________________________________________________   TAKE THESE MEDICATIONS MORNING OF SURGERY WITH A SIP OF WATER:  NONE  IF YOU ARE SPENDING  THE NIGHT AFTER SURGERY PLEASE BRING ALL YOUR PRESCRIPTION MEDICATIONS IN THEIR ORIGINAL BOTTLES.                                    DO NOT WEAR JEWERLY, MAKE UP, OR NAIL POLISH,  DO NOT WEAR LOTIONS, POWDERS, PERFUMES OR DEODORANT. DO NOT SHAVE FOR 24 HOURS PRIOR TO DAY OF SURGERY.  CONTACTS, GLASSES, OR DENTURES MAY NOT BE WORN TO SURGERY.                                    [facility] IS NOT RESPONSIBLE  FOR ANY BELONGINGS.                                                                    Marland Kitchen                                                                                                    [facility] [place] - Preparing for Surgery Before surgery, you can play an important role.  Because skin is not sterile, your skin needs to be as free of germs as possible.  You can reduce the number of germs on your skin by washing with CHG (chlorahexidine gluconate) soap before surgery.  CHG is an antiseptic cleaner which kills germs and bonds with the skin to continue killing germs even after washing. Please DO NOT use if you have an allergy to CHG or antibacterial soaps.  If your skin becomes reddened/irritated stop using the CHG and inform your nurse when you arrive at Short Stay.  Do not shave (including legs and underarms) for at least 48 hours prior to the first CHG shower.  You may shave your face/neck. Please follow these instructions carefully:  1.  Shower with CHG Soap the night before surgery and the  morning of Surgery.  2.  If you choose to wash your hair, wash your hair first as usual with your  normal  shampoo.  3.  After you shampoo, rinse your hair and body thoroughly to remove the  shampoo.                           4.  Use CHG as you would any other liquid soap.  You can apply chg directly  to the skin and wash                       Gently with a scrungie or clean washcloth.  5.  Apply the CHG Soap to your body ONLY FROM THE NECK DOWN.   Do not use on face/ open                           Wound  or open sores. Avoid contact with eyes, ears mouth and genitals (private parts).                       Wash face,  Genitals (private parts) with your normal soap.             6.  Wash thoroughly, paying special attention to the area where your surgery  will be performed.  7.  Thoroughly rinse your body with warm water from the neck down.  8.  DO NOT shower/wash with your normal soap after using and rinsing off  the CHG Soap.                9.  Pat yourself dry with a clean towel.            10.  Wear clean pajamas.            11.  Place clean sheets on your bed the night of your first shower and do not  sleep with pets. Day of Surgery : Do not apply any lotions/deodorants the morning of surgery.  Please wear clean clothes to the hospital/surgery center.  FAILURE TO FOLLOW THESE INSTRUCTIONS MAY RESULT IN THE CANCELLATION OF YOUR SURGERY PATIENT SIGNATURE_________________________________  NURSE SIGNATURE__________________________________  ________________________________________________________________________   Brenda Barker  An incentive spirometer is a tool that can help keep your lungs clear and active. This tool measures how well you are filling your lungs with each breath. Taking long deep breaths may help reverse or decrease the chance of developing breathing (pulmonary) problems (especially infection) following:  A long period of time when you are unable to move or be active. BEFORE THE PROCEDURE   If the spirometer includes an indicator to show your best effort, your nurse or respiratory therapist will set it to a desired goal.  If possible, sit up straight or lean slightly forward. Try not to slouch.  Hold the incentive spirometer in an upright position. INSTRUCTIONS FOR USE  1. Sit on the edge of your bed if possible, or sit up as far as you can in bed or on a chair. 2. Hold the incentive spirometer in an upright position. 3. Breathe out normally. 4. Place the  mouthpiece  in your mouth and seal your lips tightly around it. 5. Breathe in slowly and as deeply as possible, raising the piston or the ball toward the top of the column. 6. Hold your breath for 3-5 seconds or for as long as possible. Allow the piston or ball to fall to the bottom of the column. 7. Remove the mouthpiece from your mouth and breathe out normally. 8. Rest for a few seconds and repeat Steps 1 through 7 at least 10 times every 1-2 hours when you are awake. Take your time and take a few normal breaths between deep breaths. 9. The spirometer may include an indicator to show your best effort. Use the indicator as a goal to work toward during each repetition. 10. After each set of 10 deep breaths, practice coughing to be sure your lungs are clear. If you have an incision (the cut made at the time of surgery), support your incision when coughing by placing a pillow or rolled up towels firmly against it. Once you are able to get out of bed, walk around indoors and cough well. You may stop using the incentive spirometer when instructed by your caregiver.  RISKS AND COMPLICATIONS  Take your time so you do not get dizzy or light-headed.  If you are in pain, you may need to take or ask for pain medication before doing incentive spirometry. It is harder to take a deep breath if you are having pain. AFTER USE  Rest and breathe slowly and easily.  It can be helpful to keep track of a log of your progress. Your caregiver can provide you with a simple table to help with this. If you are using the spirometer at home, follow these instructions: [place] IF:   You are having difficultly using the spirometer.  You have trouble using the spirometer as often as instructed.  Your pain medication is not giving enough relief while using the spirometer.  You develop fever of 100.5 F (38.1 C) or higher. SEEK IMMEDIATE MEDICAL CARE IF:   You cough up bloody sputum that had not been present  before.  You develop fever of 102 F (38.9 C) or greater.  You develop worsening pain at or near the incision site. MAKE SURE YOU:   Understand these instructions.  Will watch your condition.  Will get help right away if you are not doing well or get worse. Document Released: 11/26/2006 Document Revised: 10/08/2011 Document Reviewed: 01/27/2007 [facility] Patient Information 2014 [place], [place].   ________________________________________________________________________

## 2018-12-30 ENCOUNTER — Encounter (HOSPITAL_COMMUNITY)
Admission: RE | Admit: 2018-12-30 | Discharge: 2018-12-30 | Disposition: A | Discharge status: Home / Self Care | Payer: BC Managed Care – PPO | Source: Ambulatory Visit | Attending: Obstetrics and Gynecology | Admitting: Obstetrics and Gynecology

## 2018-12-30 ENCOUNTER — Encounter (HOSPITAL_COMMUNITY): Payer: Self-pay

## 2018-12-30 ENCOUNTER — Other Ambulatory Visit: Payer: Self-pay

## 2018-12-30 DIAGNOSIS — D259 Leiomyoma of uterus, unspecified: Secondary | ICD-10-CM | POA: Diagnosis not present

## 2018-12-30 DIAGNOSIS — R102 Pelvic and perineal pain: Secondary | ICD-10-CM | POA: Insufficient documentation

## 2018-12-30 DIAGNOSIS — Z01812 Encounter for preprocedural laboratory examination: Secondary | ICD-10-CM | POA: Diagnosis present

## 2018-12-30 LAB — CBC
HCT: 38.9 % (ref 36.0–46.0)
Hemoglobin: 12.9 g/dL (ref 12.0–15.0)
MCH: 33.1 pg (ref 26.0–34.0)
MCHC: 33.2 g/dL (ref 30.0–36.0)
MCV: 99.7 fL (ref 80.0–100.0)
Platelets: 261 10*3/uL (ref 150–400)
RBC: 3.9 MIL/uL (ref 3.87–5.11)
RDW: 11.9 % (ref 11.5–15.5)
WBC: 11 10*3/uL — ABNORMAL HIGH (ref 4.0–10.5)
nRBC: 0 % (ref 0.0–0.2)

## 2018-12-30 LAB — BASIC METABOLIC PANEL
Anion gap: 6 (ref 5–15)
BUN: 12 mg/dL (ref 6–20)
CO2: 25 mmol/L (ref 22–32)
Calcium: 9 mg/dL (ref 8.9–10.3)
Chloride: 107 mmol/L (ref 98–111)
Creatinine, Ser: 0.7 mg/dL (ref 0.44–1.00)
GFR calc Af Amer: >60 mL/min (ref >60)
GFR calc non Af Amer: >60 mL/min (ref >60)
Glucose, Bld: 89 mg/dL (ref 70–99)
Potassium: 4.3 mmol/L (ref 3.5–5.1)
Sodium: 138 mmol/L (ref 135–145)

## 2019-01-01 ENCOUNTER — Other Ambulatory Visit (HOSPITAL_COMMUNITY)
Admission: RE | Admit: 2019-01-01 | Discharge: 2019-01-01 | Disposition: A | Discharge status: Home / Self Care | Payer: BC Managed Care – PPO | Source: Ambulatory Visit | Attending: Obstetrics and Gynecology | Admitting: Obstetrics and Gynecology

## 2019-01-01 DIAGNOSIS — Z1159 Encounter for screening for other viral diseases: Secondary | ICD-10-CM | POA: Insufficient documentation

## 2019-01-02 LAB — NOVEL CORONAVIRUS, NAA (HOSP ORDER, SEND-OUT TO REF LAB; TAT 18-24 HRS): SARS-CoV-2, NAA: NOT DETECTED

## 2019-01-02 NOTE — Progress Notes (Signed)
SPOKE W/  _pt      SCREENING SYMPTOMS OF COVID 19:   COUGH-no-  RUNNY NOSE--- no  SORE THROAT---no  NASAL CONGESTION---no-  SNEEZING--no--  SHORTNESS OF BREATH--no-  DIFFICULTY BREATHING--no-  TEMP >100.0 -----  UNEXPLAINED BODY ACHES---no---  CHILLS ---no-----   HEADACHES ------no---  LOSS OF SMELL/ TASTE -------- no   HAVE YOU OR ANY FAMILY MEMBER TRAVELLED PAST 14 DAYS OUT OF THE   COUNTY--no- STATE--no-- COUNTRY--no--  HAVE YOU OR ANY FAMILY MEMBER BEEN EXPOSED TO ANYONE WITH COVID 19? no

## 2019-01-05 ENCOUNTER — Other Ambulatory Visit: Payer: Self-pay

## 2019-01-05 ENCOUNTER — Ambulatory Visit (HOSPITAL_BASED_OUTPATIENT_CLINIC_OR_DEPARTMENT_OTHER): Payer: BC Managed Care – PPO | Admitting: Physician Assistant

## 2019-01-05 ENCOUNTER — Encounter (HOSPITAL_BASED_OUTPATIENT_CLINIC_OR_DEPARTMENT_OTHER)
Admission: RE | Disposition: A | Discharge status: Home / Self Care | Payer: Self-pay | Source: Home / Self Care | Attending: Obstetrics and Gynecology

## 2019-01-05 ENCOUNTER — Encounter (HOSPITAL_BASED_OUTPATIENT_CLINIC_OR_DEPARTMENT_OTHER): Payer: Self-pay | Admitting: Emergency Medicine

## 2019-01-05 ENCOUNTER — Ambulatory Visit (HOSPITAL_BASED_OUTPATIENT_CLINIC_OR_DEPARTMENT_OTHER)
Admission: RE | Admit: 2019-01-05 | Discharge: 2019-01-06 | Disposition: A | Discharge status: Home / Self Care | Payer: BC Managed Care – PPO | Attending: Obstetrics and Gynecology | Admitting: Obstetrics and Gynecology

## 2019-01-05 ENCOUNTER — Ambulatory Visit (HOSPITAL_BASED_OUTPATIENT_CLINIC_OR_DEPARTMENT_OTHER): Payer: BC Managed Care – PPO | Admitting: Anesthesiology

## 2019-01-05 DIAGNOSIS — Z90710 Acquired absence of both cervix and uterus: Secondary | ICD-10-CM | POA: Diagnosis present

## 2019-01-05 DIAGNOSIS — N800 Endometriosis of uterus: Secondary | ICD-10-CM | POA: Diagnosis not present

## 2019-01-05 DIAGNOSIS — N72 Inflammatory disease of cervix uteri: Secondary | ICD-10-CM | POA: Diagnosis not present

## 2019-01-05 DIAGNOSIS — R102 Pelvic and perineal pain unspecified side: Secondary | ICD-10-CM | POA: Diagnosis present

## 2019-01-05 DIAGNOSIS — M5030 Other cervical disc degeneration, unspecified cervical region: Secondary | ICD-10-CM | POA: Diagnosis not present

## 2019-01-05 DIAGNOSIS — D251 Intramural leiomyoma of uterus: Secondary | ICD-10-CM | POA: Insufficient documentation

## 2019-01-05 DIAGNOSIS — I10 Essential (primary) hypertension: Secondary | ICD-10-CM | POA: Insufficient documentation

## 2019-01-05 DIAGNOSIS — N736 Female pelvic peritoneal adhesions (postinfective): Secondary | ICD-10-CM | POA: Insufficient documentation

## 2019-01-05 DIAGNOSIS — D259 Leiomyoma of uterus, unspecified: Secondary | ICD-10-CM | POA: Diagnosis present

## 2019-01-05 HISTORY — PX: ROBOTIC ASSISTED LAPAROSCOPIC HYSTERECTOMY AND SALPINGECTOMY: SHX6379

## 2019-01-05 SURGERY — XI ROBOTIC ASSISTED LAPAROSCOPIC HYSTERECTOMY AND SALPINGECTOMY
Anesthesia: General | Laterality: Bilateral

## 2019-01-05 MED ORDER — SODIUM CHLORIDE 0.9 % IR SOLN
Status: DC | PRN
  Administered 2019-01-05: 3000 mL

## 2019-01-05 MED ORDER — ACETAMINOPHEN 500 MG PO TABS
ORAL_TABLET | ORAL | Status: AC
  Filled 2019-01-05: qty 2

## 2019-01-05 MED ORDER — FENTANYL CITRATE (PF) 250 MCG/5ML IJ SOLN
INTRAMUSCULAR | Status: AC
  Filled 2019-01-05: qty 5

## 2019-01-05 MED ORDER — LIDOCAINE HCL 2 % IJ SOLN
INTRAMUSCULAR | Status: AC
  Filled 2019-01-05: qty 20

## 2019-01-05 MED ORDER — MENTHOL 3 MG MT LOZG
1.0000 | LOZENGE | OROMUCOSAL | Status: DC | PRN
  Filled 2019-01-05: qty 9

## 2019-01-05 MED ORDER — DEXTROSE IN LACTATED RINGERS 5 % IV SOLN
INTRAVENOUS | Status: DC
  Administered 2019-01-05: 20:00:00 via INTRAVENOUS
  Filled 2019-01-05 (×2): qty 1000

## 2019-01-05 MED ORDER — MIDAZOLAM HCL 2 MG/2ML IJ SOLN
INTRAMUSCULAR | Status: DC | PRN
  Administered 2019-01-05: 2 mg via INTRAVENOUS

## 2019-01-05 MED ORDER — ROCURONIUM BROMIDE 10 MG/ML (PF) SYRINGE
PREFILLED_SYRINGE | INTRAVENOUS | Status: DC | PRN
  Administered 2019-01-05: 60 mg via INTRAVENOUS
  Administered 2019-01-05: 10 mg via INTRAVENOUS
  Administered 2019-01-05: 20 mg via INTRAVENOUS

## 2019-01-05 MED ORDER — OXYCODONE HCL 5 MG PO TABS
5.0000 mg | ORAL_TABLET | ORAL | Status: DC | PRN
  Administered 2019-01-05: 5 mg via ORAL
  Administered 2019-01-06 (×2): 10 mg via ORAL
  Administered 2019-01-06: 5 mg via ORAL
  Filled 2019-01-05: qty 2

## 2019-01-05 MED ORDER — FENTANYL CITRATE (PF) 100 MCG/2ML IJ SOLN
INTRAMUSCULAR | Status: DC | PRN
  Administered 2019-01-05: 100 ug via INTRAVENOUS
  Administered 2019-01-05: 50 ug via INTRAVENOUS
  Administered 2019-01-05: 100 ug via INTRAVENOUS

## 2019-01-05 MED ORDER — ONDANSETRON HCL 4 MG PO TABS
4.0000 mg | ORAL_TABLET | Freq: Four times a day (QID) | ORAL | Status: DC | PRN
  Filled 2019-01-05: qty 1

## 2019-01-05 MED ORDER — SUGAMMADEX SODIUM 200 MG/2ML IV SOLN
INTRAVENOUS | Status: AC
  Filled 2019-01-05: qty 2

## 2019-01-05 MED ORDER — KETAMINE HCL 10 MG/ML IJ SOLN
INTRAMUSCULAR | Status: DC | PRN
  Administered 2019-01-05: 15 mg via INTRAVENOUS
  Administered 2019-01-05: 25 mg via INTRAVENOUS

## 2019-01-05 MED ORDER — LACTATED RINGERS IV SOLN
INTRAVENOUS | Status: DC
  Administered 2019-01-05 (×3): via INTRAVENOUS
  Filled 2019-01-05: qty 1000

## 2019-01-05 MED ORDER — ONDANSETRON HCL 4 MG/2ML IJ SOLN
INTRAMUSCULAR | Status: AC
  Filled 2019-01-05: qty 2

## 2019-01-05 MED ORDER — CEFAZOLIN SODIUM-DEXTROSE 2-4 GM/100ML-% IV SOLN
INTRAVENOUS | Status: AC
  Filled 2019-01-05: qty 100

## 2019-01-05 MED ORDER — PROPOFOL 10 MG/ML IV BOLUS
INTRAVENOUS | Status: AC
  Filled 2019-01-05: qty 20

## 2019-01-05 MED ORDER — KETOROLAC TROMETHAMINE 30 MG/ML IJ SOLN
30.0000 mg | Freq: Once | INTRAMUSCULAR | Status: AC | PRN
  Administered 2019-01-05: 30 mg via INTRAVENOUS
  Filled 2019-01-05: qty 1

## 2019-01-05 MED ORDER — MEPERIDINE HCL 25 MG/ML IJ SOLN
6.2500 mg | INTRAMUSCULAR | Status: DC | PRN
  Filled 2019-01-05: qty 1

## 2019-01-05 MED ORDER — ONDANSETRON HCL 4 MG/2ML IJ SOLN
4.0000 mg | Freq: Four times a day (QID) | INTRAMUSCULAR | Status: DC | PRN
  Filled 2019-01-05: qty 2

## 2019-01-05 MED ORDER — LIDOCAINE 2% (20 MG/ML) 5 ML SYRINGE
INTRAMUSCULAR | Status: AC
  Filled 2019-01-05: qty 5

## 2019-01-05 MED ORDER — HYDROMORPHONE HCL 1 MG/ML IJ SOLN
INTRAMUSCULAR | Status: AC
  Filled 2019-01-05: qty 1

## 2019-01-05 MED ORDER — ONDANSETRON HCL 4 MG/2ML IJ SOLN
INTRAMUSCULAR | Status: DC | PRN
  Administered 2019-01-05: 4 mg via INTRAVENOUS

## 2019-01-05 MED ORDER — PANTOPRAZOLE SODIUM 40 MG PO TBEC
40.0000 mg | DELAYED_RELEASE_TABLET | Freq: Every day | ORAL | Status: DC
  Administered 2019-01-05: 20:00:00 40 mg via ORAL
  Filled 2019-01-05: qty 1

## 2019-01-05 MED ORDER — DEXAMETHASONE SODIUM PHOSPHATE 10 MG/ML IJ SOLN
INTRAMUSCULAR | Status: DC | PRN
  Administered 2019-01-05: 10 mg via INTRAVENOUS

## 2019-01-05 MED ORDER — OXYCODONE HCL 5 MG PO TABS
ORAL_TABLET | ORAL | Status: AC
  Filled 2019-01-05: qty 2

## 2019-01-05 MED ORDER — BUPIVACAINE HCL (PF) 0.25 % IJ SOLN
INTRAMUSCULAR | Status: DC | PRN
  Administered 2019-01-05: 25 mL

## 2019-01-05 MED ORDER — DEXAMETHASONE SODIUM PHOSPHATE 10 MG/ML IJ SOLN
INTRAMUSCULAR | Status: AC
  Filled 2019-01-05: qty 1

## 2019-01-05 MED ORDER — LIDOCAINE 20MG/ML (2%) 15 ML SYRINGE OPTIME
INTRAMUSCULAR | Status: DC | PRN
  Administered 2019-01-05: 1.5 mg/kg/h via INTRAVENOUS

## 2019-01-05 MED ORDER — SUGAMMADEX SODIUM 200 MG/2ML IV SOLN
INTRAVENOUS | Status: DC | PRN
  Administered 2019-01-05: 200 mg via INTRAVENOUS

## 2019-01-05 MED ORDER — PROPOFOL 10 MG/ML IV BOLUS
INTRAVENOUS | Status: DC | PRN
  Administered 2019-01-05: 150 mg via INTRAVENOUS

## 2019-01-05 MED ORDER — ARTIFICIAL TEARS OPHTHALMIC OINT
TOPICAL_OINTMENT | OPHTHALMIC | Status: AC
  Filled 2019-01-05: qty 3.5

## 2019-01-05 MED ORDER — KETOROLAC TROMETHAMINE 30 MG/ML IJ SOLN
INTRAMUSCULAR | Status: AC
  Filled 2019-01-05: qty 1

## 2019-01-05 MED ORDER — HYDROMORPHONE HCL 1 MG/ML IJ SOLN
0.2500 mg | INTRAMUSCULAR | Status: DC | PRN
  Administered 2019-01-05 (×2): 0.5 mg via INTRAVENOUS
  Filled 2019-01-05: qty 0.5

## 2019-01-05 MED ORDER — MIDAZOLAM HCL 2 MG/2ML IJ SOLN
INTRAMUSCULAR | Status: AC
  Filled 2019-01-05: qty 2

## 2019-01-05 MED ORDER — CEFAZOLIN SODIUM-DEXTROSE 2-4 GM/100ML-% IV SOLN
2.0000 g | INTRAVENOUS | Status: AC
  Administered 2019-01-05: 2 g via INTRAVENOUS
  Filled 2019-01-05: qty 100

## 2019-01-05 MED ORDER — SUCCINYLCHOLINE CHLORIDE 200 MG/10ML IV SOSY
PREFILLED_SYRINGE | INTRAVENOUS | Status: AC
  Filled 2019-01-05: qty 10

## 2019-01-05 MED ORDER — ACETAMINOPHEN 500 MG PO TABS
1000.0000 mg | ORAL_TABLET | Freq: Four times a day (QID) | ORAL | Status: DC
  Administered 2019-01-05 – 2019-01-06 (×3): 1000 mg via ORAL
  Filled 2019-01-05: qty 2

## 2019-01-05 MED ORDER — OXYCODONE HCL 5 MG PO TABS
ORAL_TABLET | ORAL | Status: AC
  Filled 2019-01-05: qty 1

## 2019-01-05 MED ORDER — PANTOPRAZOLE SODIUM 40 MG PO TBEC
DELAYED_RELEASE_TABLET | ORAL | Status: AC
  Filled 2019-01-05: qty 1

## 2019-01-05 MED ORDER — LIDOCAINE 2% (20 MG/ML) 5 ML SYRINGE
INTRAMUSCULAR | Status: DC | PRN
  Administered 2019-01-05: 100 mg via INTRAVENOUS

## 2019-01-05 MED ORDER — KETOROLAC TROMETHAMINE 30 MG/ML IJ SOLN
30.0000 mg | Freq: Four times a day (QID) | INTRAMUSCULAR | Status: DC
  Administered 2019-01-05 – 2019-01-06 (×3): 30 mg via INTRAVENOUS
  Filled 2019-01-05: qty 1

## 2019-01-05 MED ORDER — IBUPROFEN 800 MG PO TABS
800.0000 mg | ORAL_TABLET | Freq: Four times a day (QID) | ORAL | Status: DC
  Filled 2019-01-05: qty 1

## 2019-01-05 MED ORDER — SIMETHICONE 80 MG PO CHEW
80.0000 mg | CHEWABLE_TABLET | Freq: Four times a day (QID) | ORAL | Status: DC | PRN
  Filled 2019-01-05: qty 1

## 2019-01-05 MED ORDER — PROMETHAZINE HCL 25 MG/ML IJ SOLN
6.2500 mg | INTRAMUSCULAR | Status: DC | PRN
  Filled 2019-01-05: qty 1

## 2019-01-05 MED ORDER — ROCURONIUM BROMIDE 10 MG/ML (PF) SYRINGE
PREFILLED_SYRINGE | INTRAVENOUS | Status: AC
  Filled 2019-01-05: qty 10

## 2019-01-05 SURGICAL SUPPLY — 56 items
APPLICATOR ARISTA FLEXITIP XL (MISCELLANEOUS) ×3 IMPLANT
BARRIER ADHS 3X4 INTERCEED (GAUZE/BANDAGES/DRESSINGS) IMPLANT
CANISTER SUCT 3000ML PPV (MISCELLANEOUS) ×3 IMPLANT
CATH FOLEY 3WAY  5CC 16FR (CATHETERS) ×2
CATH FOLEY 3WAY 5CC 16FR (CATHETERS) ×1 IMPLANT
COVER BACK TABLE 60X90IN (DRAPES) ×3 IMPLANT
COVER TIP SHEARS 8 DVNC (MISCELLANEOUS) ×1 IMPLANT
COVER TIP SHEARS 8MM DA VINCI (MISCELLANEOUS) ×2
DECANTER SPIKE VIAL GLASS SM (MISCELLANEOUS) ×3 IMPLANT
DEFOGGER SCOPE WARMER CLEARIFY (MISCELLANEOUS) ×3 IMPLANT
DERMABOND ADVANCED (GAUZE/BANDAGES/DRESSINGS) ×2
DERMABOND ADVANCED .7 DNX12 (GAUZE/BANDAGES/DRESSINGS) ×1 IMPLANT
DRAPE ARM DVNC X/XI (DISPOSABLE) ×4 IMPLANT
DRAPE COLUMN DVNC XI (DISPOSABLE) ×1 IMPLANT
DRAPE DA VINCI XI ARM (DISPOSABLE) ×8
DRAPE DA VINCI XI COLUMN (DISPOSABLE) ×2
DURAPREP 26ML APPLICATOR (WOUND CARE) ×3 IMPLANT
ELECT REM PT RETURN 9FT ADLT (ELECTROSURGICAL) ×3
ELECTRODE REM PT RTRN 9FT ADLT (ELECTROSURGICAL) ×1 IMPLANT
GLOVE BIOGEL PI IND STRL 7.0 (GLOVE) ×5 IMPLANT
GLOVE BIOGEL PI INDICATOR 7.0 (GLOVE) ×10
GLOVE ECLIPSE 6.5 STRL STRAW (GLOVE) ×9 IMPLANT
HEMOSTAT ARISTA ABSORB 3G PWDR (HEMOSTASIS) ×6 IMPLANT
IRRIG SUCT STRYKERFLOW 2 WTIP (MISCELLANEOUS) ×3
IRRIGATION SUCT STRKRFLW 2 WTP (MISCELLANEOUS) ×1 IMPLANT
LEGGING LITHOTOMY PAIR STRL (DRAPES) ×3 IMPLANT
NEEDLE INSUFFLATION 120MM (ENDOMECHANICALS) ×3 IMPLANT
OBTURATOR OPTICAL STANDARD 8MM (TROCAR) ×2
OBTURATOR OPTICAL STND 8 DVNC (TROCAR) ×1
OBTURATOR OPTICALSTD 8 DVNC (TROCAR) ×1 IMPLANT
OCCLUDER COLPOPNEUMO (BALLOONS) ×3 IMPLANT
PACK ROBOT WH (CUSTOM PROCEDURE TRAY) ×3 IMPLANT
PACK ROBOTIC GOWN (GOWN DISPOSABLE) ×3 IMPLANT
PACK TRENDGUARD 450 HYBRID PRO (MISCELLANEOUS) ×1 IMPLANT
PAD PREP 24X48 CUFFED NSTRL (MISCELLANEOUS) ×3 IMPLANT
PROTECTOR NERVE ULNAR (MISCELLANEOUS) ×6 IMPLANT
SEAL CANN UNIV 5-8 DVNC XI (MISCELLANEOUS) ×4 IMPLANT
SEAL XI 5MM-8MM UNIVERSAL (MISCELLANEOUS) ×8
SEALER VESSEL DA VINCI XI (MISCELLANEOUS) ×2
SEALER VESSEL EXT DVNC XI (MISCELLANEOUS) ×1 IMPLANT
SET CYSTO W/LG BORE CLAMP LF (SET/KITS/TRAYS/PACK) ×3 IMPLANT
SET TRI-LUMEN FLTR TB AIRSEAL (TUBING) ×3 IMPLANT
SUT VIC AB 0 CT1 36 (SUTURE) ×6 IMPLANT
SUT VICRYL 4-0 PS2 18IN ABS (SUTURE) ×6 IMPLANT
SUT VLOC 180 0 9IN  GS21 (SUTURE) ×2
SUT VLOC 180 0 9IN GS21 (SUTURE) ×1 IMPLANT
SUT VLOC 180 2-0 6IN GS21 (SUTURE) ×3 IMPLANT
TIP RUMI ORANGE 6.7MMX12CM (TIP) ×3 IMPLANT
TIP UTERINE 5.1X6CM LAV DISP (MISCELLANEOUS) IMPLANT
TIP UTERINE 6.7X10CM GRN DISP (MISCELLANEOUS) IMPLANT
TIP UTERINE 6.7X6CM WHT DISP (MISCELLANEOUS) IMPLANT
TIP UTERINE 6.7X8CM BLUE DISP (MISCELLANEOUS) IMPLANT
TOWEL OR 17X26 10 PK STRL BLUE (TOWEL DISPOSABLE) ×6 IMPLANT
TRENDGUARD 450 HYBRID PRO PACK (MISCELLANEOUS) ×3
TROCAR PORT AIRSEAL 8X120 (TROCAR) ×3 IMPLANT
WATER STERILE IRR 1000ML POUR (IV SOLUTION) ×3 IMPLANT

## 2019-01-05 NOTE — H&P (Signed)
Brenda Barker is an 38 y.o. female. BF presents for DaVinci robotic total hysterectomy, bilateral salpingectomy due to pelvic pain and uterine fibroid not responsive to hormonal, muscle relaxant and narcotic pain med. sono showed multiple fibroids, nl ovaries  Pertinent Gynecological History: Menses: flow is moderate Bleeding: nl Contraception: none DES exposure: denies Blood transfusions: none Sexually transmitted diseases: no past history Previous GYN Procedures: DNC  Last mammogram: normal Date: 09/2018 Last pap: normal Date: 10/07/2018 OB History: G6P2   Menstrual History: Menarche age: n/a Patient's last menstrual period was 12/23/2018 (approximate).    Past Medical History:  Diagnosis Date  . Allergy   . DDD (degenerative disc disease), cervical 2018   Early  . Hypertension    pregnancy induced -PRE-ECLAMPSIA (Pt is not on medication)  . Seizures ([place])    Last seizures was when patient was 16  . Uterine fibroid 2019    Past Surgical History:  Procedure Laterality Date  . APPENDECTOMY  2005    Family History  Problem Relation Age of Onset  . Asthma Mother     Social History:  reports that she has never smoked. She has never used smokeless tobacco. She reports current alcohol use of about 1.0 standard drinks of alcohol per week. She reports that she does not use drugs.  Allergies: No Known Allergies  Medications Prior to Admission  Medication Sig Dispense Refill Last Dose  . ibuprofen (ADVIL,MOTRIN) 200 MG tablet Take 200 mg by mouth every 6 (six) hours as needed for moderate pain.    Past Week at Unknown time    Review of Systems  All other systems reviewed and are negative.   Blood pressure (!) 145/74, pulse (!) 55, temperature 98 F (36.7 C), temperature source Oral, resp. rate 16, height 5\' 6"  (1.676 m), weight 83.1 kg, last menstrual period 12/23/2018, SpO2 98 %. Physical Exam  Constitutional: She is oriented to person, place, and time. She appears  well-developed and well-nourished.  HENT:  Head: Atraumatic.  Eyes: EOM are normal.  Neck: Neck supple.  Cardiovascular: Regular rhythm.  Respiratory: Breath sounds normal.  GI: Soft.  Genitourinary:    Vagina normal.     Genitourinary Comments: Cervix closed  uterus RV 12 wk size irreg And no palp mass   Musculoskeletal: Normal range of motion.  Neurological: She is alert and oriented to person, place, and time.  Skin: Skin is warm and dry.  Psychiatric: She has a normal mood and affect.    No results found for this or any previous visit (from the past 24 hour(s)).  No results found.  Assessment/Plan: Pelvic pain  Uterine ifbroids P) DaVinci robotic total hysterectomy, bilateral salpingectomy. Risk of surgery reviewed including infection, bleeding, poss need for blood transfusion and its risk( hiv, hepatitis, acute rxn), injury to bladder, bowel, ureter, ovarian preservation and its consequences including poss need for surgery in the future for ov cancer, cyst and or pain. All ? answered  Laterica Matarazzo A Aleksey Newbern 01/05/2019, 12:55 PM

## 2019-01-05 NOTE — Transfer of Care (Signed)
Immediate Anesthesia Transfer of Care Note  Patient: Brenda Barker  Procedure(s) Performed: XI ROBOTIC ASSISTED LAPAROSCOPIC HYSTERECTOMY AND SALPINGECTOMY (Bilateral )  Patient Location: PACU  Anesthesia Type:General  Level of Consciousness: awake, alert , oriented and patient cooperative  Airway & Oxygen Therapy: Patient Spontanous Breathing and Patient connected to nasal cannula oxygen  Post-op Assessment: Report given to RN and Post -op Vital signs reviewed and stable  Post vital signs: Reviewed and stable  Last Vitals:  Vitals Value Taken Time  BP    Temp    Pulse    Resp    SpO2      Last Pain:  Vitals:   01/05/19 1105  TempSrc: Oral  PainSc: 5       Patients Stated Pain Goal: 3 (90/90/30 1499)  Complications: No apparent anesthesia complications

## 2019-01-05 NOTE — Anesthesia Procedure Notes (Signed)
Procedure Name: Intubation Date/Time: 01/05/2019 1:12 PM Performed by: Wanita Chamberlain, CRNA Pre-anesthesia Checklist: Patient identified, Emergency Drugs available, Suction available, Patient being monitored and Timeout performed Patient Re-evaluated:Patient Re-evaluated prior to induction Oxygen Delivery Method: Circle system utilized Preoxygenation: Pre-oxygenation with 100% oxygen Induction Type: IV induction Ventilation: Mask ventilation without difficulty Laryngoscope Size: Mac and 3 Grade View: Grade II Tube type: Oral Number of attempts: 1 Airway Equipment and Method: Stylet

## 2019-01-05 NOTE — Brief Op Note (Signed)
01/05/2019  4:05 PM  PATIENT:  Brenda Barker  38 y.o. female  PRE-OPERATIVE DIAGNOSIS:  Pelvic Pain, Uterine Fibroid  POST-OPERATIVE DIAGNOSIS:  Pelvic Pain, Uterine Fibroid, pelvic adhesions  PROCEDURE:  Da Vinci robotic total hysterectomy, bilateral salpingectomy  SURGEON:  Surgeon(s) and Role:    * Servando Salina, MD - Primary  PHYSICIAN ASSISTANT:   ASSISTANTS: Derrell Lolling, CNM   ANESTHESIA:   general Findings: adhesions in the posterior cul de sac, periovarian, right  Lateral quad, fibroid uterus, cystic ovaries, nl tubes EBL:  25 mL   BLOOD ADMINISTERED:none  DRAINS: none   LOCAL MEDICATIONS USED:  MARCAINE     SPECIMEN:  Source of Specimen:  uterus with cervix, tubes  DISPOSITION OF SPECIMEN:  PATHOLOGY  COUNTS:  YES  TOURNIQUET:  * No tourniquets in log *  DICTATION: .Other Dictation: Dictation Number [id number]  PLAN OF CARE: Admit for overnight observation  PATIENT DISPOSITION:  PACU - hemodynamically stable.   Delay start of Pharmacological VTE agent (>24hrs) due to surgical blood loss or risk of bleeding: no

## 2019-01-05 NOTE — Anesthesia Postprocedure Evaluation (Signed)
Anesthesia Post Note  Patient: Brenda Barker  Procedure(s) Performed: XI ROBOTIC ASSISTED LAPAROSCOPIC HYSTERECTOMY AND SALPINGECTOMY (Bilateral )     Patient location during evaluation: PACU Anesthesia Type: General Level of consciousness: awake and alert Pain management: pain level controlled Vital Signs Assessment: post-procedure vital signs reviewed and stable Respiratory status: spontaneous breathing, nonlabored ventilation and respiratory function stable Cardiovascular status: blood pressure returned to baseline and stable Postop Assessment: no apparent nausea or vomiting Anesthetic complications: no    Last Vitals:  Vitals:   01/05/19 1700 01/05/19 1722  BP: 129/71 (!) 144/91  Pulse: (!) 51 68  Resp: 13 15  Temp:  36.8 C  SpO2: 100% 100%    Last Pain:  Vitals:   01/05/19 1700  TempSrc:   PainSc: [name]

## 2019-01-05 NOTE — Anesthesia Preprocedure Evaluation (Signed)
Anesthesia Evaluation  Patient identified by MRN, date of birth, ID band Patient awake    Reviewed: Allergy & Precautions, NPO status , Patient's Chart, lab work & pertinent test results  Airway Mallampati: I       Dental no notable dental hx. (+) Teeth Intact   Pulmonary neg pulmonary ROS,    Pulmonary exam normal breath sounds clear to auscultation       Cardiovascular Normal cardiovascular exam Rhythm:Regular Rate:Normal     Neuro/Psych negative psych ROS   GI/Hepatic negative GI ROS, Neg liver ROS,   Endo/Other  negative endocrine ROS  Renal/GU   negative genitourinary   Musculoskeletal   Abdominal Normal abdominal exam  (+)   Peds  Hematology negative hematology ROS (+)   Anesthesia Other Findings   Reproductive/Obstetrics                             Anesthesia Physical Anesthesia Plan  ASA: I  Anesthesia Plan: General   Post-op Pain Management:    Induction: Intravenous  PONV Risk Score and Plan: 4 or greater and Ondansetron, Dexamethasone, Scopolamine patch - Pre-op and Midazolam  Airway Management Planned: Oral ETT  Additional Equipment:   Intra-op Plan:   Post-operative Plan: Extubation in OR  Informed Consent: I have reviewed the patients History and Physical, chart, labs and discussed the procedure including the risks, benefits and alternatives for the proposed anesthesia with the patient or authorized representative who has indicated his/her understanding and acceptance.     Dental advisory given  Plan Discussed with: CRNA  Anesthesia Plan Comments:         Anesthesia Quick Evaluation

## 2019-01-06 DIAGNOSIS — N72 Inflammatory disease of cervix uteri: Secondary | ICD-10-CM | POA: Diagnosis not present

## 2019-01-06 LAB — CBC
HCT: 35.4 % — ABNORMAL LOW (ref 36.0–46.0)
Hemoglobin: 11.6 g/dL — ABNORMAL LOW (ref 12.0–15.0)
MCH: 33 pg (ref 26.0–34.0)
MCHC: 32.8 g/dL (ref 30.0–36.0)
MCV: 100.9 fL — ABNORMAL HIGH (ref 80.0–100.0)
Platelets: 254 10*3/uL (ref 150–400)
RBC: 3.51 MIL/uL — ABNORMAL LOW (ref 3.87–5.11)
RDW: 11.6 % (ref 11.5–15.5)
WBC: 15.4 10*3/uL — ABNORMAL HIGH (ref 4.0–10.5)
nRBC: 0 % (ref 0.0–0.2)

## 2019-01-06 LAB — BASIC METABOLIC PANEL
Anion gap: 7 (ref 5–15)
BUN: 5 mg/dL — ABNORMAL LOW (ref 6–20)
CO2: 22 mmol/L (ref 22–32)
Calcium: 8.1 mg/dL — ABNORMAL LOW (ref 8.9–10.3)
Chloride: 104 mmol/L (ref 98–111)
Creatinine, Ser: 0.69 mg/dL (ref 0.44–1.00)
GFR calc Af Amer: >60 mL/min (ref >60)
GFR calc non Af Amer: >60 mL/min (ref >60)
Glucose, Bld: 141 mg/dL — ABNORMAL HIGH (ref 70–99)
Potassium: 3.9 mmol/L (ref 3.5–5.1)
Sodium: 133 mmol/L — ABNORMAL LOW (ref 135–145)

## 2019-01-06 MED ORDER — KETOROLAC TROMETHAMINE 30 MG/ML IJ SOLN
INTRAMUSCULAR | Status: AC
  Filled 2019-01-06: qty 1

## 2019-01-06 MED ORDER — IBUPROFEN 800 MG PO TABS: 800.0000 mg | ORAL_TABLET | Freq: Three times a day (TID) | ORAL | 5 refills | Status: DC | PRN

## 2019-01-06 MED ORDER — OXYCODONE HCL 5 MG PO TABS
ORAL_TABLET | ORAL | Status: AC
  Filled 2019-01-06: qty 2

## 2019-01-06 MED ORDER — OXYCODONE HCL 5 MG PO TABS: 5.0000 mg | ORAL_TABLET | ORAL | 0 refills | Status: AC | PRN

## 2019-01-06 MED ORDER — ACETAMINOPHEN 500 MG PO TABS
ORAL_TABLET | ORAL | Status: AC
  Filled 2019-01-06: qty 2

## 2019-01-06 NOTE — Discharge Instructions (Signed)
Call if temperature greater than equal to 100.4, nothing per vagina for 4-6 weeks or severe nausea vomiting, increased incisional pain , drainage or redness in the incision site, no straining with bowel movements, showers no bath °

## 2019-01-06 NOTE — Progress Notes (Signed)
Subjective: Patient reports tolerating PO, + flatus and no problems voiding.    Objective: I have reviewed patient's vital signs.  vital signs, intake and output and labs. Vitals:   01/06/19 0200 01/06/19 0600  BP: 119/67 127/65  Pulse: 68 70  Resp:  16  Temp: 98.5 F (36.9 C) 97.7 F (36.5 C)  SpO2: 99% 94%   I/O last 3 completed shifts: In: 3369.2 [P.O.:680; I.V.:2589.2; IV Piggyback:100] Out: 4388 [Urine:1380; Blood:25] Total I/O In: 210 [P.O.:210] Out: 400 [Urine:400]  Lab Results  Component Value Date   WBC 15.4 (H) 01/06/2019   HGB 11.6 (L) 01/06/2019   HCT 35.4 (L) 01/06/2019   MCV 100.9 (H) 01/06/2019   PLT 254 01/06/2019   Lab Results  Component Value Date   CREATININE 0.69 01/06/2019    EXAM General: alert, cooperative and no distress Resp: clear to auscultation bilaterally Cardio: regular rate and rhythm, S1, S2 normal, no murmur, click, rub or gallop GI: soft, non-tender; bowel sounds normal; no masses,  no organomegaly and incision: clean/d/i Extremities: no edema, redness or tenderness in the calves or thighs Vaginal Bleeding: minimal  Assessment: s/p Procedure(s): XI ROBOTIC ASSISTED LAPAROSCOPIC HYSTERECTOMY AND SALPINGECTOMY: stable, progressing well and tolerating diet  Plan: Encourage ambulation Discontinue IV fluids Discharge home  D/c instructions reviewed F/u 2 wk   LOS: 0 days    Marvene Staff, MD 01/06/2019 9:00 AM    01/06/2019, 9:00 AM

## 2019-01-06 NOTE — Discharge Summary (Signed)
Physician Discharge Summary  Patient ID: Brenda Barker MRN: [id number] DOB/AGE: January 14, 1981 38 y.o.  Admit date: 01/05/2019 Discharge date: 01/06/2019  Admission Diagnoses: pelvic pain, fibroid uterus  Discharge Diagnoses: pelvic pain, fibroid uterus, pelvic adhesions Active Problems:   Pelvic pain   Status post total hysterectomy   Discharged Condition: stable  Hospital Course: pt was admitted to [facility] where she underwent [place] robotic total hysterectomy, bilateral salpingectomy. Pt had uncomplicated postoperative course  Consults: None  Significant Diagnostic Studies: labs:  CBC    Component Value Date/Time   WBC 15.4 (H) 01/06/2019 0554   RBC 3.51 (L) 01/06/2019 0554   HGB 11.6 (L) 01/06/2019 0554   HCT 35.4 (L) 01/06/2019 0554   PLT 254 01/06/2019 0554   MCV 100.9 (H) 01/06/2019 0554   MCV 102.1 (A) 07/06/2013 1850   MCH 33.0 01/06/2019 0554   MCHC 32.8 01/06/2019 0554   RDW 11.6 01/06/2019 0554   LYMPHSABS 3.1 06/26/2010 2300   MONOABS 1.3 (H) 06/26/2010 2300   EOSABS 0.5 06/26/2010 2300   BASOSABS 0.0 06/26/2010 2300   BMP Latest Ref Rng & Units 01/06/2019 12/30/2018 06/26/2010  Glucose 70 - 99 mg/dL 141(H) 89 100(H)  BUN 6 - 20 mg/dL 5(L) 12 7  Creatinine 0.44 - 1.00 mg/dL 0.69 0.70 0.66  Sodium 135 - 145 mmol/L 133(L) 138 139  Potassium 3.5 - 5.1 mmol/L 3.9 4.3 4.1  Chloride 98 - 111 mmol/L 104 107 105  CO2 22 - 32 mmol/L 22 25 27   Calcium 8.9 - 10.3 mg/dL 8.1(L) 9.0 9.3    Treatments: surgery: da Vinci robotic total hysterectomy, bilateral salpingectomy  Discharge Exam: Blood pressure 127/65, pulse 70, temperature 97.7 F (36.5 C), resp. rate 16, height 5\' 6"  (1.676 m), weight 83.1 kg, last menstrual period 12/23/2018, SpO2 94 %. General appearance: alert, cooperative and no distress Resp: clear to auscultation bilaterally Cardio: regular rate and rhythm, S1, S2 normal, no murmur, click, rub or gallop GI: soft, non-tender; bowel sounds normal; no masses,   no organomegaly Pelvic: deferred Skin: Skin color, texture, turgor normal. No rashes or lesions Incision/Wound: d/c/i  Disposition: Discharge disposition: 01-Home or Self Care       Discharge Instructions    Call MD for:  persistant nausea and vomiting   Complete by:  As directed    Call MD for:  temperature >100.4   Complete by:  As directed    Diet - low sodium heart healthy   Complete by:  As directed    May walk up steps   Complete by:  As directed      Allergies as of 01/06/2019   No Known Allergies     Medication List    TAKE these medications   ibuprofen 800 MG tablet Commonly known as:  ADVIL Take 1 tablet (800 mg total) by mouth every 8 (eight) hours as needed. What changed:    medication strength  how much to take  when to take this  reasons to take this   oxyCODONE 5 MG immediate release tablet Commonly known as:  Oxy IR/ROXICODONE Take 1-2 tablets (5-10 mg total) by mouth every 4 (four) hours as needed for up to 7 days for moderate pain.      Follow-up Information    Servando Salina, MD Follow up in 2 week(s).   Specialty:  Obstetrics and Gynecology Contact information: [address] [place] [address] [phone]           Signed: Marvene Staff  01/06/2019, 9:09 AM

## 2019-01-06 NOTE — Op Note (Signed)
Brenda Barker, FAVORITE MEDICAL RECORD MG:[id number] ACCOUNT [id number] DATE OF BIRTH:04-18-1981 FACILITY: [facility] LOCATION: [facility] PHYSICIAN:Adonica Fukushima A. Danica Camarena, MD  OPERATIVE REPORT  DATE OF PROCEDURE:  01/05/2019  PREOPERATIVE DIAGNOSES:  Pelvic pain, fibroid uterus.  PROCEDURE:  Da Vinci robotic total hysterectomy, bilateral salpingectomy.  POSTOPERATIVE DIAGNOSES:  Pelvic pain, fibroid uterus, pelvic adhesions.  ANESTHESIA:  General.  SURGEON:  Servando Salina, MD  ASSISTANTS:  Derrell Lolling, CNM  DESCRIPTION OF PROCEDURE:  Under adequate general anesthesia, the patient was placed in the dorsal lithotomy position.  She was positioned for robotic surgery.  She was sterilely prepped and draped in the usual fashion.  A 3-way Foley catheter was  sterilely placed.  A weighted speculum was placed in the vagina.  A Sims retractor was placed anteriorly.  The cervix was parous.  Vicryl 0 figure-of-eight suture was placed on the anterior and on the posterior lip of the cervix.  The uterus sounded to  12 cm.  A #12 uterine manipulator was introduced into the uterine cavity with a medium RUMI cup.  The retractors were removed.  Attention was then turned to the abdomen.  Marcaine 0.25% was injected supraumbilically.  A small vertical incision was made  supraumbilically.  A Veress needle was introduced, tested with saline.  Three liters of CO2 was insufflated.  Opening pressure of 4.  The Veress needle was then removed.  A robotic port was then placed.  The robotic camera was inserted.  Entry into the  abdomen without incident.  The patient was placed in Trendelenburg position.  Panoramic inspection revealed that the uterus was irregular, enlarged with multiple fibroids, and the manipulator had gone through the posterior aspect fundally.  The remaining port sites were then placed, 2 on the right 8 cm apart, 1 on the left distally, and in the interim space an 8 mm AirSeal was placed, all  under direct visualization.  Once this was done, the robot was docked.  In arm #1 was a vessel sealer, in arm #3 was a bipolar grasper, and in arm #4, the monopolar scissors.  I then went to the surgical console.  At the surgical console, the pelvis was further inspected.  Posteriorly, there was bleeding from the perforation.  The blood was aspirated.  At that  point, it was then noted that the patient had adhesions in the posterior cul-de-sac involving bowels as well as the peritoneum.  These adhesions were lysed carefully.  Adhesions around the ovaries were also lysed, and the ureters were seen peristalsing.  On the left, the procedure was then started with the removal of the left fallopian tube by serially clamping the mesosalpinx.  The retroperitoneal space on the left was then opened, window was then placed, and then the left uteroovarian ligament was  serially clamped, cauterized, and cut.  The posterior leaf of the broad ligament was then opened.  The round ligament was clamped, cauterized, and then cut.  The anterior leaf of the broad ligament was then opened, carried around to the vesicouterine  peritoneum which was opened transversely and displaced inferiorly with sharp dissection.  The uterine vessels were skeletonized on the left.  They were serially clamped, cauterized, and then cut.  The same procedure was performed on the contralateral side after identifying the ureter as well.  Once both uterine vessels were clamped, cauterized, and cut, the vaginal insufflator was then placed.  The cervicovaginal junction was at  the upper part of the RUMI cup was opened anteriorly and carried around  circumferentially with the uterus subsequently being removed.  Bleeding along the vaginal cuff was then noted and cauterized.  The instruments were then replaced with the vessel sealer being placed by the long-tip forceps and the monopolar scissors being replaced by the mega suture needle driver.  A 0 V-Loc suture  was then inserted.  The bladder was displaced further inferiorly, and using a 0 V-Loc x2, the vaginal cuff was then closed.  Small bleeding along the undersurface was cauterized.   When good hemostasis subsequently achieved, the ovarian pedicles were inspected, small bleeders cauterized, and scar tissue on the right side was also further removed.  Arista potato starch was then placed on the vaginal cuff.  The procedure was  completed.  The robot instruments were removed.  The robot was undocked.  I went back to the patient's bedside sterilely.  Additional potato starch was placed, good hemostasis remained, and the robotic ports were removed under direct visualization.  The abdomen was deflated and the incisions closed with 4-0 Vicryl subcuticular closures.  Intraoperative inspection of the vaginal cuff and digitally at the end of the case was done with good approximation noted.  SPECIMEN:  Uterus with cervix and tubes sent to pathology.  ESTIMATED BLOOD LOSS:  50 mL.  INTRAOPERATIVE FLUIDS:  1300 mL.  URINE OUTPUT:  150 clear yellow urine.  COUNTS:  Sponge and instrument counts x2 was correct.  COMPLICATIONS:  None.  DISPOSITION:  The patient tolerated the procedure well and was transferred to recovery room in stable condition.  LN/NUANCE  D:01/05/2019 T:01/06/2019 JOB:[id number]

## 2019-01-07 ENCOUNTER — Encounter (HOSPITAL_BASED_OUTPATIENT_CLINIC_OR_DEPARTMENT_OTHER): Payer: Self-pay | Admitting: Obstetrics and Gynecology

## 2022-12-27 ENCOUNTER — Emergency Department (HOSPITAL_COMMUNITY): Payer: BC Managed Care – PPO

## 2022-12-27 ENCOUNTER — Other Ambulatory Visit: Payer: Self-pay

## 2022-12-27 ENCOUNTER — Observation Stay (HOSPITAL_COMMUNITY)
Admission: EM | Admit: 2022-12-27 | Discharge: 2022-12-29 | Disposition: A | Discharge status: Home / Self Care | Payer: BC Managed Care – PPO | Attending: Internal Medicine | Admitting: Internal Medicine

## 2022-12-27 DIAGNOSIS — I2510 Atherosclerotic heart disease of native coronary artery without angina pectoris: Secondary | ICD-10-CM

## 2022-12-27 DIAGNOSIS — T782XXA Anaphylactic shock, unspecified, initial encounter: Secondary | ICD-10-CM

## 2022-12-27 DIAGNOSIS — R7989 Other specified abnormal findings of blood chemistry: Secondary | ICD-10-CM

## 2022-12-27 DIAGNOSIS — Z1152 Encounter for screening for COVID-19: Secondary | ICD-10-CM | POA: Diagnosis not present

## 2022-12-27 DIAGNOSIS — I272 Pulmonary hypertension, unspecified: Secondary | ICD-10-CM | POA: Insufficient documentation

## 2022-12-27 DIAGNOSIS — I281 Aneurysm of pulmonary artery: Secondary | ICD-10-CM | POA: Diagnosis not present

## 2022-12-27 DIAGNOSIS — E872 Acidosis, unspecified: Secondary | ICD-10-CM | POA: Insufficient documentation

## 2022-12-27 DIAGNOSIS — Z79899 Other long term (current) drug therapy: Secondary | ICD-10-CM | POA: Insufficient documentation

## 2022-12-27 DIAGNOSIS — T7840XA Allergy, unspecified, initial encounter: Secondary | ICD-10-CM | POA: Diagnosis not present

## 2022-12-27 DIAGNOSIS — R062 Wheezing: Secondary | ICD-10-CM | POA: Insufficient documentation

## 2022-12-27 DIAGNOSIS — R0789 Other chest pain: Secondary | ICD-10-CM | POA: Diagnosis not present

## 2022-12-27 DIAGNOSIS — D72829 Elevated white blood cell count, unspecified: Secondary | ICD-10-CM | POA: Insufficient documentation

## 2022-12-27 DIAGNOSIS — E785 Hyperlipidemia, unspecified: Secondary | ICD-10-CM | POA: Diagnosis not present

## 2022-12-27 DIAGNOSIS — R791 Abnormal coagulation profile: Secondary | ICD-10-CM | POA: Diagnosis not present

## 2022-12-27 DIAGNOSIS — R0602 Shortness of breath: Secondary | ICD-10-CM | POA: Diagnosis present

## 2022-12-27 DIAGNOSIS — R9431 Abnormal electrocardiogram [ECG] [EKG]: Secondary | ICD-10-CM | POA: Insufficient documentation

## 2022-12-27 LAB — CBC WITH DIFFERENTIAL/PLATELET
Abs Immature Granulocytes: 0.4 10*3/uL — ABNORMAL HIGH (ref 0.00–0.07)
Basophils Absolute: 0.1 10*3/uL (ref 0.0–0.1)
Basophils Relative: 0 %
Eosinophils Absolute: 0.2 10*3/uL (ref 0.0–0.5)
Eosinophils Relative: 1 %
HCT: 42.6 % (ref 36.0–46.0)
Hemoglobin: 14.5 g/dL (ref 12.0–15.0)
Immature Granulocytes: 2 %
Lymphocytes Relative: 15 %
Lymphs Abs: 3.9 10*3/uL (ref 0.7–4.0)
MCH: 33.3 pg (ref 26.0–34.0)
MCHC: 34 g/dL (ref 30.0–36.0)
MCV: 97.9 fL (ref 80.0–100.0)
Monocytes Absolute: 1.5 10*3/uL — ABNORMAL HIGH (ref 0.1–1.0)
Monocytes Relative: 6 %
Neutro Abs: 20.8 10*3/uL — ABNORMAL HIGH (ref 1.7–7.7)
Neutrophils Relative %: 76 %
Platelets: 333 10*3/uL (ref 150–400)
RBC: 4.35 MIL/uL (ref 3.87–5.11)
RDW: 11.6 % (ref 11.5–15.5)
WBC: 26.8 10*3/uL — ABNORMAL HIGH (ref 4.0–10.5)
nRBC: 0 % (ref 0.0–0.2)

## 2022-12-27 LAB — COMPREHENSIVE METABOLIC PANEL
ALT: 19 U/L (ref 0–44)
AST: 23 U/L (ref 15–41)
Albumin: 4.3 g/dL (ref 3.5–5.0)
Alkaline Phosphatase: 50 U/L (ref 38–126)
Anion gap: 12 (ref 5–15)
BUN: 15 mg/dL (ref 6–20)
CO2: 24 mmol/L (ref 22–32)
Calcium: 9 mg/dL (ref 8.9–10.3)
Chloride: 100 mmol/L (ref 98–111)
Creatinine, Ser: 0.92 mg/dL (ref 0.44–1.00)
GFR, Estimated: >60 mL/min (ref >60)
Glucose, Bld: 196 mg/dL — ABNORMAL HIGH (ref 70–99)
Potassium: 3.6 mmol/L (ref 3.5–5.1)
Sodium: 136 mmol/L (ref 135–145)
Total Bilirubin: 0.8 mg/dL (ref 0.3–1.2)
Total Protein: 7.8 g/dL (ref 6.5–8.1)

## 2022-12-27 LAB — CK: Total CK: 84 U/L (ref 38–234)

## 2022-12-27 LAB — TROPONIN I (HIGH SENSITIVITY)
Troponin I (High Sensitivity): 19 ng/L — ABNORMAL HIGH (ref <18)
Troponin I (High Sensitivity): 42 ng/L — ABNORMAL HIGH (ref <18)

## 2022-12-27 LAB — I-STAT BETA HCG BLOOD, ED (MC, WL, AP ONLY): I-stat hCG, quantitative: <5 m[IU]/mL (ref <5)

## 2022-12-27 LAB — D-DIMER, QUANTITATIVE: D-Dimer, Quant: 2.49 ug/mL-FEU — ABNORMAL HIGH (ref 0.00–0.50)

## 2022-12-27 MED ORDER — EPINEPHRINE 0.3 MG/0.3ML IJ SOAJ
0.3000 mg | Freq: Once | INTRAMUSCULAR | Status: AC
  Administered 2022-12-27: 0.3 mg via INTRAMUSCULAR
  Filled 2022-12-27: qty 0.3

## 2022-12-27 MED ORDER — IOHEXOL 350 MG/ML SOLN
80.0000 mL | Freq: Once | INTRAVENOUS | Status: AC | PRN
  Administered 2022-12-27: 80 mL via INTRAVENOUS

## 2022-12-27 MED ORDER — DEXAMETHASONE SODIUM PHOSPHATE 10 MG/ML IJ SOLN
10.0000 mg | Freq: Once | INTRAMUSCULAR | Status: AC
  Administered 2022-12-27: 10 mg via INTRAVENOUS
  Filled 2022-12-27: qty 1

## 2022-12-27 MED ORDER — DIPHENHYDRAMINE HCL 50 MG/ML IJ SOLN
50.0000 mg | Freq: Once | INTRAMUSCULAR | Status: AC
  Administered 2022-12-27: 50 mg via INTRAVENOUS
  Filled 2022-12-27: qty 1

## 2022-12-27 MED ORDER — FAMOTIDINE IN NACL 20-0.9 MG/50ML-% IV SOLN
20.0000 mg | Freq: Once | INTRAVENOUS | Status: AC
  Administered 2022-12-27: 20 mg via INTRAVENOUS
  Filled 2022-12-27: qty 50

## 2022-12-27 MED ORDER — KETOROLAC TROMETHAMINE 30 MG/ML IJ SOLN
30.0000 mg | Freq: Once | INTRAMUSCULAR | Status: AC
  Administered 2022-12-27: 30 mg via INTRAVENOUS
  Filled 2022-12-27: qty 1

## 2022-12-27 MED ORDER — ENOXAPARIN SODIUM 40 MG/0.4ML IJ SOSY
40.0000 mg | PREFILLED_SYRINGE | INTRAMUSCULAR | Status: DC
  Administered 2022-12-28: 40 mg via SUBCUTANEOUS
  Filled 2022-12-27 (×2): qty 0.4

## 2022-12-27 NOTE — ED Provider Notes (Signed)
Brenda Barker   CSN: [id number] Arrival date & time: 12/27/22  1924     History  Chief Complaint  Patient presents with   Allergic Reaction    Brenda Barker is a 42 y.o. female here presenting with shortness of breath.  Patient was working out with her friend.  She states that she was exerting herself and then had sudden onset of shortness of breath.  Patient did not feel well and had to stop.  EMS was called and patient was noted to be hypotensive.  Patient was given IV fluids and Zofran.  A friend came in the room and noticed that she broke out in hives after she got here.  She did not have any rash previously.  Denies any new foods or detergents or shampoos.  The history is provided by the patient.       Home Medications Prior to Admission medications   Medication Sig Start Date End Date Taking? Authorizing Provider  CLARITIN 10 MG tablet Take 10 mg by mouth daily.   Yes [provider]  TYLENOL 500 MG tablet Take 500-1,000 mg by mouth every 6 (six) hours as needed for mild pain or headache.   Yes [provider]      Allergies    Zofran [ondansetron] and Soy allergy    Review of Systems   Review of Systems  Respiratory:  Positive for shortness of breath.   All other systems reviewed and are negative.   Physical Exam Updated Vital Signs BP 124/82   Pulse 69   Temp (!) 97.5 F (36.4 C) (Oral)   Resp 20   LMP  (LMP Unknown)   SpO2 100%  Physical Exam Vitals and nursing Barker reviewed.  Constitutional:      Comments: Slightly uncomfortable  HENT:     Head: Normocephalic.     Right Ear: Tympanic membrane normal.     Left Ear: Tympanic membrane normal.     Nose: Nose normal.     Mouth/Throat:     Mouth: Mucous membranes are moist.  Eyes:     Extraocular Movements: Extraocular movements intact.     Pupils: Pupils are equal, round, and reactive to light.  Cardiovascular:     Rate and  Rhythm: Normal rate and regular rhythm.     Pulses: Normal pulses.     Heart sounds: Normal heart sounds.  Pulmonary:     Effort: Pulmonary effort is normal.     Breath sounds: Normal breath sounds.  Abdominal:     General: Abdomen is flat.     Palpations: Abdomen is soft.  Musculoskeletal:        General: Normal range of motion.     Cervical back: Normal range of motion and neck supple.  Skin:    Capillary Refill: Capillary refill takes less than 2 seconds.     Comments: Diffuse hives on torso and arms and legs.  Not involving mucous membranes.  Neurological:     General: No focal deficit present.     Mental Status: She is oriented to person, place, and time.  Psychiatric:        Mood and Affect: Mood normal.        Behavior: Behavior normal.     ED Results / Procedures / Treatments   Labs (all labs ordered are listed, but only abnormal results are displayed) Labs Reviewed  CBC WITH DIFFERENTIAL/PLATELET - Abnormal; Notable for the following components:  Result Value   WBC 26.8 (*)    Neutro Abs 20.8 (*)    Monocytes Absolute 1.5 (*)    Abs Immature Granulocytes 0.40 (*)    All other components within normal limits  COMPREHENSIVE METABOLIC PANEL - Abnormal; Notable for the following components:   Glucose, Bld 196 (*)    All other components within normal limits  D-DIMER, QUANTITATIVE (NOT AT [facility]) - Abnormal; Notable for the following components:   D-Dimer, Quant 2.49 (*)    All other components within normal limits  TROPONIN I (HIGH SENSITIVITY) - Abnormal; Notable for the following components:   Troponin I (High Sensitivity) 19 (*)    All other components within normal limits  TROPONIN I (HIGH SENSITIVITY) - Abnormal; Notable for the following components:   Troponin I (High Sensitivity) 42 (*)    All other components within normal limits  CK  I-STAT BETA HCG BLOOD, ED ([facility], [facility], [facility] ONLY)  I-STAT BETA HCG BLOOD, ED ([facility], [facility], [facility] ONLY)    EKG EKG  Interpretation  Date/Time:  Thursday Dec 27 2022 20:47:10 EDT Ventricular Rate:  69 PR Interval:  167 QRS Duration: 102 QT Interval:  473 QTC Calculation: 507 R Axis:   39 Text Interpretation: Sinus rhythm LAE, consider biatrial enlargement Probable anteroseptal infarct, old No significant change since last tracing Confirmed by Richardean Canal [id number]) on 12/27/2022 8:49:05 PM  Radiology CT Angio Chest PE W and/or Wo Contrast  Result Date: 12/27/2022 CLINICAL DATA:  Pulmonary embolism (PE) suspected, high prob EXAM: CT ANGIOGRAPHY CHEST WITH CONTRAST TECHNIQUE: Multidetector CT imaging of the chest was performed using the standard protocol during bolus administration of intravenous contrast. Multiplanar CT image reconstructions and MIPs were obtained to evaluate the vascular anatomy. RADIATION DOSE REDUCTION: This exam was performed according to the departmental dose-optimization program which includes automated exposure control, adjustment of the mA and/or kV according to patient size and/or use of iterative reconstruction technique. CONTRAST:  80mL OMNIPAQUE IOHEXOL 350 MG/ML SOLN COMPARISON:  Chest x-ray 12/27/2022 FINDINGS: Cardiovascular: Satisfactory opacification of the pulmonary arteries to the segmental level. No evidence of pulmonary embolism. The main pulmonary artery is enlarged measuring up to 3.3 cm. Prominent heart size. No significant pericardial effusion. The thoracic aorta is normal in caliber. No atherosclerotic plaque of the thoracic aorta. No coronary artery calcifications. Mediastinum/Nodes: No enlarged mediastinal, hilar, or axillary lymph nodes. Thyroid gland, trachea, and esophagus demonstrate no significant findings. Lungs/Pleura: Bilateral lower lobe bronchial wall thickening, right greater than left. Bibasilar atelectasis. No focal consolidation. No pulmonary nodule. No pulmonary mass. No pleural effusion. No pneumothorax. Upper Abdomen: No acute abnormality. Musculoskeletal: No  chest wall abnormality. No suspicious lytic or blastic osseous lesions. No acute displaced fracture. Review of the MIP images confirms the above findings. IMPRESSION: 1. No pulmonary embolus. 2. Enlarged main pulmonary artery-correlate for pulmonary hypertension. 3. Bilateral lower lobe bronchial wall thickening suggestive of small airway disease. Electronically Signed   By: Tish Frederickson M.D.   On: 12/27/2022 22:21   DG Chest Port 1 View  Result Date: 12/27/2022 CLINICAL DATA:  Hypotension and shortness of breath EXAM: PORTABLE CHEST 1 VIEW COMPARISON:  None Available. FINDINGS: The heart size and mediastinal contours are within normal limits. Both lungs are clear. The visualized skeletal structures are unremarkable. IMPRESSION: No active disease. Electronically Signed   By: Alcide Clever M.D.   On: 12/27/2022 20:26    Procedures Procedures    CRITICAL CARE Performed by: Richardean Canal   Total  critical care time: 39 minutes  Critical care time was exclusive of separately billable procedures and treating other patients.  Critical care was necessary to treat or prevent imminent or life-threatening deterioration.  Critical care was time spent personally by me on the following activities: development of treatment plan with patient and/or surrogate as well as nursing, discussions with consultants, evaluation of patient's response to treatment, examination of patient, obtaining history from patient or surrogate, ordering and performing treatments and interventions, ordering and review of laboratory studies, ordering and review of radiographic studies, pulse oximetry and re-evaluation of patient's condition.   Medications Ordered in ED Medications  EPINEPHrine (EPI-PEN) injection 0.3 mg (0.3 mg Intramuscular Given 12/27/22 2007)  dexamethasone (DECADRON) injection 10 mg (10 mg Intravenous Given 12/27/22 2008)  diphenhydrAMINE (BENADRYL) injection 50 mg (50 mg Intravenous Given 12/27/22 2007)   famotidine (PEPCID) IVPB 20 mg premix (0 mg Intravenous Stopped 12/27/22 2053)  ketorolac (TORADOL) 30 MG/ML injection 30 mg (30 mg Intravenous Given 12/27/22 2213)  iohexol (OMNIPAQUE) 350 MG/ML injection 80 mL (80 mLs Intravenous Contrast Given 12/27/22 2156)    ED Course/ Medical Decision Making/ A&P                             Medical Decision Making Brenda Barker is a 42 y.o. female here presenting with hives and shortness of breath.  Patient has shortness of breath at the gym and was given Zofran and now has diffuse hives.  I think the shortness of breath may be from overexertion versus PE.  Plan to get labs and troponin and D-dimer and hydrate patient.  I think the hives likely allergic reaction to Zofran versus unknown allergen.  Will give EpiPen and steroids and Benadryl and Pepcid and reassess.  10 pm Patient's white blood cell count is 26.8 which is likely a stress reaction from anaphylaxis.  Patient's D-dimer is elevated at 2.5.  I ordered a CTA chest  11:28 PM Patient CTA chest showed no PE.  Patient's second troponin went from 19 to 42.  I discussed case with cardiologist, Dr. Hyacinth Meeker.  He recommends admission for echo and transfer to [facility] and cardiology will follow.  Given that patient had exertional chest pain and shortness of breath concerned that this is cardiac in nature.  Of Barker, her anaphylaxis has resolved and her rash has improved.  At this point the hospitalist will admit.   Problems Addressed: Anaphylaxis, initial encounter: acute illness or injury Elevated troponin: acute illness or injury  Amount and/or Complexity of Data Reviewed Labs: ordered. Decision-making details documented in ED Course. Radiology: ordered and independent interpretation performed. Decision-making details documented in ED Course. ECG/medicine tests: ordered.  Risk Prescription drug management. Decision regarding hospitalization.    Final Clinical Impression(s) / ED Diagnoses Final  diagnoses:  Elevated troponin  Anaphylaxis, initial encounter    Rx / DC Orders ED Discharge Orders     None         Charlynne Pander, MD 12/27/22 2329

## 2022-12-27 NOTE — ED Notes (Signed)
Pt drinking ginger rale with no complaints

## 2022-12-27 NOTE — H&P (Incomplete)
History and Physical  Brenda Barker ZOX:[id number] DOB: 11-02-1980 DOA: 12/27/2022  Referring physician: Dr. Silverio Lay, EDP  PCP: Pcp, No  Outpatient Specialists: None [facility]  Chief Complaint: Chest pain and severe shortness of breath after running on the treadmill.  HPI: Brenda Barker is a 42 y.o. female with medical history significant for allergic rhinitis  ED Course: ***  Review of Systems: Review of systems as noted in the HPI. All other systems reviewed and are negative.   No past medical history on file. *** The histories are not reviewed yet. Please review them in the "History" navigator section and refresh this SmartLink.  Social History:  has no history on file for tobacco use, alcohol use, and drug use.   Allergies  Allergen Reactions  . Zofran [Ondansetron] Hives, Shortness Of Breath, Rash and Other (See Comments)    Skin burning, also (had to receive an EpiPen to reverse the allergic reaction)  . Soy Allergy Nausea And Vomiting    No family history on file.  ***  Prior to Admission medications   Medication Sig Start Date End Date Taking? Authorizing Provider  CLARITIN 10 MG tablet Take 10 mg by mouth daily.   Yes [provider]  TYLENOL 500 MG tablet Take 500-1,000 mg by mouth every 6 (six) hours as needed for mild pain or headache.   Yes [provider]    Physical Exam: BP 134/70 (BP Location: Right Arm)   Pulse 65   Temp 98.3 F (36.8 C) (Oral)   Resp 16   LMP  (LMP Unknown)   SpO2 100%   General: 42 y.o. year-old female well developed well nourished in no acute distress.  Alert and oriented x3. Cardiovascular: Regular rate and rhythm with no rubs or gallops.  No thyromegaly or JVD noted.  No lower extremity edema. 2/4 pulses in all 4 extremities. Respiratory: Clear to auscultation with no wheezes or rales. Good inspiratory effort. Abdomen: Soft nontender nondistended with normal bowel sounds  x4 quadrants. Muskuloskeletal: No cyanosis, clubbing or edema noted bilaterally Neuro: CN II-XII intact, strength, sensation, reflexes Skin: No ulcerative lesions noted or rashes Psychiatry: Judgement and insight appear normal. Mood is appropriate for condition and setting          Labs on Admission:  Basic Metabolic Panel: Recent Labs  Lab 12/27/22 2035  NA 136  K 3.6  CL 100  CO2 24  GLUCOSE 196*  BUN 15  CREATININE 0.92  CALCIUM 9.0   Liver Function Tests: Recent Labs  Lab 12/27/22 2035  AST 23  ALT 19  ALKPHOS 50  BILITOT 0.8  PROT 7.8  ALBUMIN 4.3   No results for input(s): "LIPASE", "AMYLASE" in the last 168 hours. No results for input(s): "AMMONIA" in the last 168 hours. CBC: Recent Labs  Lab 12/27/22 2035  WBC 26.8*  NEUTROABS 20.8*  HGB 14.5  HCT 42.6  MCV 97.9  PLT 333   Cardiac Enzymes: Recent Labs  Lab 12/27/22 2035  CKTOTAL 84    BNP (last 3 results) No results for input(s): "BNP" in the last 8760 hours.  ProBNP (last 3 results) No results for input(s): "PROBNP" in the last 8760 hours.  CBG: No results for input(s): "GLUCAP" in the last 168 hours.  Radiological Exams on Admission: CT Angio Chest PE W and/or Wo Contrast  Result Date: 12/27/2022 CLINICAL DATA:  Pulmonary embolism (PE) suspected, high prob EXAM: CT ANGIOGRAPHY CHEST WITH CONTRAST TECHNIQUE: Multidetector CT imaging  of the chest was performed using the standard protocol during bolus administration of intravenous contrast. Multiplanar CT image reconstructions and MIPs were obtained to evaluate the vascular anatomy. RADIATION DOSE REDUCTION: This exam was performed according to the departmental dose-optimization program which includes automated exposure control, adjustment of the mA and/or kV according to patient size and/or use of iterative reconstruction technique. CONTRAST:  80mL OMNIPAQUE IOHEXOL 350 MG/ML SOLN COMPARISON:  Chest x-ray 12/27/2022 FINDINGS: Cardiovascular:  Satisfactory opacification of the pulmonary arteries to the segmental level. No evidence of pulmonary embolism. The main pulmonary artery is enlarged measuring up to 3.3 cm. Prominent heart size. No significant pericardial effusion. The thoracic aorta is normal in caliber. No atherosclerotic plaque of the thoracic aorta. No coronary artery calcifications. Mediastinum/Nodes: No enlarged mediastinal, hilar, or axillary lymph nodes. Thyroid gland, trachea, and esophagus demonstrate no significant findings. Lungs/Pleura: Bilateral lower lobe bronchial wall thickening, right greater than left. Bibasilar atelectasis. No focal consolidation. No pulmonary nodule. No pulmonary mass. No pleural effusion. No pneumothorax. Upper Abdomen: No acute abnormality. Musculoskeletal: No chest wall abnormality. No suspicious lytic or blastic osseous lesions. No acute displaced fracture. Review of the MIP images confirms the above findings. IMPRESSION: 1. No pulmonary embolus. 2. Enlarged main pulmonary artery-correlate for pulmonary hypertension. 3. Bilateral lower lobe bronchial wall thickening suggestive of small airway disease. Electronically Signed   By: Tish Frederickson M.D.   On: 12/27/2022 22:21   DG Chest Port 1 View  Result Date: 12/27/2022 CLINICAL DATA:  Hypotension and shortness of breath EXAM: PORTABLE CHEST 1 VIEW COMPARISON:  None Available. FINDINGS: The heart size and mediastinal contours are within normal limits. Both lungs are clear. The visualized skeletal structures are unremarkable. IMPRESSION: No active disease. Electronically Signed   By: Alcide Clever M.D.   On: 12/27/2022 20:26    EKG: I independently viewed the EKG done and my findings are as followed: ***   Assessment/Plan Present on Admission: . Elevated troponin  Principal Problem:   Elevated troponin   DVT prophylaxis: ***   Code Status: ***   Family Communication: ***   Disposition Plan: ***   Consults called: ***   Admission  status: ***    Status is: Observation {Observation:23811}   Darlin Drop MD [facility] Pager [phone]  If 7PM-7AM, please contact night-coverage [email or web address] Password [facility]  12/27/2022, 11:57 PM

## 2022-12-27 NOTE — H&P (Addendum)
History and Physical  Brenda Barker ZOX:[id number] DOB: 02-06-1981 DOA: 12/27/2022  Referring physician: Dr. Silverio Lay, EDP  PCP: Pcp, No  Outpatient Specialists: None Patient coming from: Home through [facility]  Chief Complaint: Chest pain and severe shortness of breath after running on the treadmill.  HPI: Brenda Barker is a 42 y.o. female with medical history significant for allergic rhinitis, who presented to [facility] ED via EMS from [facility] due to severe dyspnea and centrally located chest pain after running on the treadmill.  The patient endorses generally exercising 4-5 times per week, including running on a treadmill, however for the past 6 months has not done much exercise.  She restarted her routine exercise today by running on the treadmill.  States she was having a very difficult time breathing.  The center of her chest was burning, she felt as if her heart was sinking in.  Endorses generally having difficulties running in cold temperatures.  States for the past week she has had difficulty sleeping, frequently finds herself wheezing at night and thought it was related to her allergies or sinuses.  Denies use of tobacco.  Admits to occasionally smoking marijuana.  No use of cocaine.  Reportedly the patient's blood pressure was low at the gym.  Due to severe dyspnea and hypotension, EMS was activated.  The patient received IV fluid 500 cc en route via EMS with improvement of her blood pressure.  Additionally the patient received IV Zofran en route.  After the patient arrived to the ED reportedly was having hives with concern for anaphylaxis.  The patient received 1 dose of IM epi 0.3 milligram x 1, IV Pepcid 20 mg x 1, IV Benadryl 50 mg x 1 and IV Decadron 10 mg x 1.  Chest x-ray was nonacute.  Lab studies were notable for elevated troponin 19, up trended to 42.  Elevated D-dimer 2.49 which prompted a CT angio chest which revealed the following findings:  1. No pulmonary embolus. 2.  Enlarged main pulmonary artery-correlate for pulmonary hypertension. 3. Bilateral lower lobe bronchial wall thickening suggestive of small airway disease.  EDP discussed the case with cardiology, Dr. Hyacinth Meeker, due to uptrending high-sensitivity troponin.  Recommended admission at [facility] by hospitalist service and to obtain a 2D echo.  Cardiology will see in consultation once the patient arrives to [facility].  Due to concern for possible undiagnosed asthma, a PFT was ordered and pulmonary was consulted as a routine consult and will see in the morning.  Admitted by [facility], hospitalist service, to telemetry cardiac unit as observation status.  ED Course: Temperature 98.3.  BP 129/81, pulse 66, respiratory 14, O2 saturation 100% on room air.  Lab studies remarkable for WBC 26.8, serum glucose 106.  High-sensitivity troponin 19, repeat 42.  Review of Systems: Review of systems as noted in the HPI. All other systems reviewed and are negative.  Past medical history: None.  Social History:  has no history on file for tobacco use, alcohol use, and drug use.   Allergies  Allergen Reactions   Zofran [Ondansetron] Hives, Shortness Of Breath, Rash and Other (See Comments)    Skin burning, also (had to receive an EpiPen to reverse the allergic reaction)   Soy Allergy Nausea And Vomiting   Family history: Mother with history of reactive airway disease.   Prior to Admission medications   Medication Sig Start Date End Date Taking? Authorizing Provider  CLARITIN 10 MG tablet Take 10 mg by mouth daily.  Yes [provider]  TYLENOL 500 MG tablet Take 500-1,000 mg by mouth every 6 (six) hours as needed for mild pain or headache.   Yes [provider]    Physical Exam: BP 134/70 (BP Location: Right Arm)   Pulse 65   Temp 98.3 F (36.8 C) (Oral)   Resp 16   LMP  (LMP Unknown)   SpO2 100%   General: 42 y.o. year-old female well developed well nourished  in no acute distress.  Alert and oriented x3. Cardiovascular: Tachycardic with no rubs or gallops.  No thyromegaly or JVD noted.  No lower extremity edema. 2/4 pulses in all 4 extremities. Respiratory: Mild wheezing and rales at bases. Good inspiratory effort. Abdomen: Soft nontender nondistended with normal bowel sounds x4 quadrants. Muskuloskeletal: No cyanosis, clubbing or edema noted bilaterally Neuro: CN II-XII intact, strength, sensation, reflexes Skin: No ulcerative lesions noted or rashes Psychiatry: Judgement and insight appear normal. Mood is appropriate for condition and setting          Labs on Admission:  Basic Metabolic Panel: Recent Labs  Lab 12/27/22 2035  NA 136  K 3.6  CL 100  CO2 24  GLUCOSE 196*  BUN 15  CREATININE 0.92  CALCIUM 9.0   Liver Function Tests: Recent Labs  Lab 12/27/22 2035  AST 23  ALT 19  ALKPHOS 50  BILITOT 0.8  PROT 7.8  ALBUMIN 4.3   No results for input(s): "LIPASE", "AMYLASE" in the last 168 hours. No results for input(s): "AMMONIA" in the last 168 hours. CBC: Recent Labs  Lab 12/27/22 2035  WBC 26.8*  NEUTROABS 20.8*  HGB 14.5  HCT 42.6  MCV 97.9  PLT 333   Cardiac Enzymes: Recent Labs  Lab 12/27/22 2035  CKTOTAL 84    BNP (last 3 results) No results for input(s): "BNP" in the last 8760 hours.  ProBNP (last 3 results) No results for input(s): "PROBNP" in the last 8760 hours.  CBG: No results for input(s): "GLUCAP" in the last 168 hours.  Radiological Exams on Admission: CT Angio Chest PE W and/or Wo Contrast  Result Date: 12/27/2022 CLINICAL DATA:  Pulmonary embolism (PE) suspected, high prob EXAM: CT ANGIOGRAPHY CHEST WITH CONTRAST TECHNIQUE: Multidetector CT imaging of the chest was performed using the standard protocol during bolus administration of intravenous contrast. Multiplanar CT image reconstructions and MIPs were obtained to evaluate the vascular anatomy. RADIATION DOSE REDUCTION: This exam was  performed according to the departmental dose-optimization program which includes automated exposure control, adjustment of the mA and/or kV according to patient size and/or use of iterative reconstruction technique. CONTRAST:  80mL OMNIPAQUE IOHEXOL 350 MG/ML SOLN COMPARISON:  Chest x-ray 12/27/2022 FINDINGS: Cardiovascular: Satisfactory opacification of the pulmonary arteries to the segmental level. No evidence of pulmonary embolism. The main pulmonary artery is enlarged measuring up to 3.3 cm. Prominent heart size. No significant pericardial effusion. The thoracic aorta is normal in caliber. No atherosclerotic plaque of the thoracic aorta. No coronary artery calcifications. Mediastinum/Nodes: No enlarged mediastinal, hilar, or axillary lymph nodes. Thyroid gland, trachea, and esophagus demonstrate no significant findings. Lungs/Pleura: Bilateral lower lobe bronchial wall thickening, right greater than left. Bibasilar atelectasis. No focal consolidation. No pulmonary nodule. No pulmonary mass. No pleural effusion. No pneumothorax. Upper Abdomen: No acute abnormality. Musculoskeletal: No chest wall abnormality. No suspicious lytic or blastic osseous lesions. No acute displaced fracture. Review of the MIP images confirms the above findings. IMPRESSION: 1. No pulmonary embolus. 2. Enlarged main pulmonary artery-correlate for pulmonary  hypertension. 3. Bilateral lower lobe bronchial wall thickening suggestive of small airway disease. Electronically Signed   By: Tish Frederickson M.D.   On: 12/27/2022 22:21   DG Chest Port 1 View  Result Date: 12/27/2022 CLINICAL DATA:  Hypotension and shortness of breath EXAM: PORTABLE CHEST 1 VIEW COMPARISON:  None Available. FINDINGS: The heart size and mediastinal contours are within normal limits. Both lungs are clear. The visualized skeletal structures are unremarkable. IMPRESSION: No active disease. Electronically Signed   By: Alcide Clever M.D.   On: 12/27/2022 20:26    EKG:  I independently viewed the EKG done and my findings are as followed: Sinus rhythm rate of 69.  LAE consider biatrial enlargement probable anteroseptal infarct, old.  QTc 507.  Assessment/Plan Present on Admission:  Elevated troponin  Principal Problem:   Elevated troponin  Elevated troponin, exertional chest pain Rule out ACS Added full dose aspirin 325 mg x 1, sublingual nitro as needed First set of high-sensitivity troponin 19, repeat 42 Continue to trend high-sensitivity troponin and repeat twelve-lead EKG Cardiology consulted, recommended transfer to [facility] where cardiology will see in consultation. Follow fasting lipid panel and hemoglobin A1c Follow complete 2D echo Closely monitor on telemetry  Possible exercise-induced asthma Bilateral lower lobe bronchial wall thickening, right greater than left suggestive of small airway disease PFT ordered and pulmonary consulted, will see in consultation in the morning DuoNebs as needed for wheezing and shortness of breath  Elevated D-dimer CT angio chest was negative for PE Check COVID 19 screening test and acute viral respiratory panel.  Enlargement of main pulmonary artery, correlate for pulmonary hypertension Follow 2D echo Cardiology and pulmonary consulted.  QTc prolongation Admission twelve-lead EKG revealed QTc 507 Avoid QTc prolonging agents Optimize magnesium level greater than 2.0 Optimize potassium level greater than 4.0. Monitor on telemetry.  Allergic reaction to Zofran, POA Reportedly the patient had hives that were noted in the ED, was administered Zofran en route by EMS Treated in the ED with EpiPen, IV Decadron, IV Pepcid, and IV Benadryl. Zofran added to list of patient's allergies.  Leukocytosis, possibly reactive, demargination from IV steroids WBC 26.8 Afebrile and nonseptic appearing Repeat CBC, obtain lactic acid and procalcitonin without antibiotics Treat if active infective process is  suspected  Hyperglycemia with no reported history of type I or type II diabetes, suspect hyperglycemia is contributed by IV steroids Serum glucose 196 N.p.o. except sips and meds until seen by cardiology Obtain hemoglobin A1c Start insulin sliding scale every 4 hours while NPO    DVT prophylaxis: Subcu Lovenox daily  Code Status: Full code  Family Communication: Updated the patient's sister at bedside.  Disposition Plan: Admitted to telemetry cardiac unit at [facility].  Consults called: Cardiology, Dr. Hyacinth Meeker, Pulmonary, Dr. Gaynell Face.  Admission status: Observation status.   Status is: Observation    Darlin Drop MD [facility] Pager [phone]  If 7PM-7AM, please contact night-coverage [email or web address] Password [facility]  12/27/2022, 11:57 PM

## 2022-12-27 NOTE — ED Triage Notes (Signed)
Pt BIB EMS from [facility] for [facility] and hypotension.   Bp: 90/50 to 104/64 after fluids  20g Left Arm fluid given

## 2022-12-27 NOTE — ED Notes (Signed)
Family came to nurses station upset stating patient is now having hives and is cold after being on the ambulance truck, states she was given zofran and fluids.

## 2022-12-28 ENCOUNTER — Encounter (HOSPITAL_COMMUNITY): Payer: Self-pay | Admitting: Internal Medicine

## 2022-12-28 ENCOUNTER — Observation Stay (HOSPITAL_BASED_OUTPATIENT_CLINIC_OR_DEPARTMENT_OTHER): Payer: BC Managed Care – PPO

## 2022-12-28 DIAGNOSIS — R7989 Other specified abnormal findings of blood chemistry: Secondary | ICD-10-CM

## 2022-12-28 DIAGNOSIS — R079 Chest pain, unspecified: Secondary | ICD-10-CM

## 2022-12-28 LAB — ECHOCARDIOGRAM COMPLETE
Area-P 1/2: 2.72 cm2
Height: 66 in
S' Lateral: 2.7 cm
Weight: 2712.54 oz

## 2022-12-28 LAB — RESPIRATORY PANEL BY PCR

## 2022-12-28 LAB — GLUCOSE, CAPILLARY
Glucose-Capillary: 146 mg/dL — ABNORMAL HIGH (ref 70–99)
Glucose-Capillary: 130 mg/dL — ABNORMAL HIGH (ref 70–99)
Glucose-Capillary: 101 mg/dL — ABNORMAL HIGH (ref 70–99)
Glucose-Capillary: 131 mg/dL — ABNORMAL HIGH (ref 70–99)
Glucose-Capillary: 128 mg/dL — ABNORMAL HIGH (ref 70–99)

## 2022-12-28 LAB — LIPID PANEL
Cholesterol: 218 mg/dL — ABNORMAL HIGH (ref 0–200)
HDL: 54 mg/dL (ref >40)
LDL Cholesterol: 150 mg/dL — ABNORMAL HIGH (ref 0–99)
Total CHOL/HDL Ratio: 4 RATIO
Triglycerides: 68 mg/dL (ref <150)
VLDL: 14 mg/dL (ref 0–40)

## 2022-12-28 LAB — BRAIN NATRIURETIC PEPTIDE: B Natriuretic Peptide: 18 pg/mL (ref 0.0–100.0)

## 2022-12-28 LAB — MAGNESIUM: Magnesium: 1.8 mg/dL (ref 1.7–2.4)

## 2022-12-28 LAB — CREATININE, SERUM
Creatinine, Ser: 0.84 mg/dL (ref 0.44–1.00)
GFR, Estimated: >60 mL/min (ref >60)

## 2022-12-28 LAB — BASIC METABOLIC PANEL WITH GFR
Anion gap: 11 (ref 5–15)
BUN: 12 mg/dL (ref 6–20)
CO2: 20 mmol/L — ABNORMAL LOW (ref 22–32)
Calcium: 8.8 mg/dL — ABNORMAL LOW (ref 8.9–10.3)
Chloride: 102 mmol/L (ref 98–111)
Creatinine, Ser: 0.95 mg/dL (ref 0.44–1.00)
GFR, Estimated: >60 mL/min (ref >60)
Glucose, Bld: 170 mg/dL — ABNORMAL HIGH (ref 70–99)
Potassium: 4.2 mmol/L (ref 3.5–5.1)
Sodium: 133 mmol/L — ABNORMAL LOW (ref 135–145)

## 2022-12-28 LAB — CBG MONITORING, ED: Glucose-Capillary: 180 mg/dL — ABNORMAL HIGH (ref 70–99)

## 2022-12-28 LAB — LACTIC ACID, PLASMA
Lactic Acid, Venous: 4.1 mmol/L (ref 0.5–1.9)
Lactic Acid, Venous: 1.4 mmol/L (ref 0.5–1.9)

## 2022-12-28 LAB — CBC
HCT: 36.8 % (ref 36.0–46.0)
Hemoglobin: 12.5 g/dL (ref 12.0–15.0)
MCH: 33.1 pg (ref 26.0–34.0)
MCHC: 34 g/dL (ref 30.0–36.0)
MCV: 97.4 fL (ref 80.0–100.0)
Platelets: 279 K/uL (ref 150–400)
RBC: 3.78 MIL/uL — ABNORMAL LOW (ref 3.87–5.11)
RDW: 11.8 % (ref 11.5–15.5)
WBC: 19.2 K/uL — ABNORMAL HIGH (ref 4.0–10.5)
nRBC: 0 % (ref 0.0–0.2)

## 2022-12-28 LAB — PHOSPHORUS: Phosphorus: 3.4 mg/dL (ref 2.5–4.6)

## 2022-12-28 LAB — TROPONIN I (HIGH SENSITIVITY): Troponin I (High Sensitivity): 88 ng/L — ABNORMAL HIGH (ref <18)

## 2022-12-28 LAB — HIV ANTIBODY (ROUTINE TESTING W REFLEX): HIV Screen 4th Generation wRfx: NONREACTIVE

## 2022-12-28 LAB — PROCALCITONIN: Procalcitonin: <0.1 ng/mL

## 2022-12-28 LAB — SARS CORONAVIRUS 2 BY RT PCR: SARS Coronavirus 2 by RT PCR: NEGATIVE

## 2022-12-28 MED ORDER — NITROGLYCERIN 0.4 MG SL SUBL: 0.4000 mg | SUBLINGUAL_TABLET | SUBLINGUAL | Status: DC | PRN

## 2022-12-28 MED ORDER — POTASSIUM CHLORIDE CRYS ER 20 MEQ PO TBCR
40.0000 meq | EXTENDED_RELEASE_TABLET | Freq: Once | ORAL | Status: AC
  Administered 2022-12-28: 40 meq via ORAL
  Filled 2022-12-28: qty 2

## 2022-12-28 MED ORDER — IPRATROPIUM-ALBUTEROL 0.5-2.5 (3) MG/3ML IN SOLN
3.0000 mL | Freq: Once | RESPIRATORY_TRACT | Status: DC
  Filled 2022-12-28: qty 3

## 2022-12-28 MED ORDER — PROCHLORPERAZINE EDISYLATE 10 MG/2ML IJ SOLN: 5.0000 mg | Freq: Four times a day (QID) | INTRAMUSCULAR | Status: DC | PRN

## 2022-12-28 MED ORDER — LORATADINE 10 MG PO TABS
10.0000 mg | ORAL_TABLET | Freq: Every day | ORAL | Status: DC
  Filled 2022-12-28: qty 1

## 2022-12-28 MED ORDER — ACETAMINOPHEN 325 MG PO TABS
650.0000 mg | ORAL_TABLET | Freq: Four times a day (QID) | ORAL | Status: DC | PRN
  Filled 2022-12-28: qty 2

## 2022-12-28 MED ORDER — ASPIRIN 325 MG PO TBEC
325.0000 mg | DELAYED_RELEASE_TABLET | Freq: Once | ORAL | Status: AC
  Administered 2022-12-28: 325 mg via ORAL
  Filled 2022-12-28: qty 1

## 2022-12-28 MED ORDER — PERFLUTREN LIPID MICROSPHERE
1.0000 mL | INTRAVENOUS | Status: AC | PRN
  Administered 2022-12-28: 5 mL via INTRAVENOUS

## 2022-12-28 MED ORDER — MELATONIN 5 MG PO TABS: 5.0000 mg | ORAL_TABLET | Freq: Every evening | ORAL | Status: DC | PRN

## 2022-12-28 MED ORDER — INSULIN ASPART 100 UNIT/ML IJ SOLN
0.0000 [IU] | INTRAMUSCULAR | Status: DC
  Administered 2022-12-28: 1 [IU] via SUBCUTANEOUS
  Administered 2022-12-28: 2 [IU] via SUBCUTANEOUS
  Administered 2022-12-28 – 2022-12-29 (×4): 1 [IU] via SUBCUTANEOUS
  Filled 2022-12-28: qty 0.09

## 2022-12-28 MED ORDER — METOPROLOL TARTRATE 50 MG PO TABS
50.0000 mg | ORAL_TABLET | Freq: Every day | ORAL | Status: DC | PRN
  Filled 2022-12-28: qty 1

## 2022-12-28 MED ORDER — POLYETHYLENE GLYCOL 3350 17 G PO PACK: 17.0000 g | PACK | Freq: Every day | ORAL | Status: DC | PRN

## 2022-12-28 MED ORDER — IPRATROPIUM-ALBUTEROL 0.5-2.5 (3) MG/3ML IN SOLN: 3.0000 mL | RESPIRATORY_TRACT | Status: DC | PRN

## 2022-12-28 NOTE — Hospital Course (Addendum)
42 year old female with no significant past medical history presented with chest pain and severe dyspnea after running on the treadmill in the gym.Reportedly blood pressure was low at the gym, sent to the ED, and route blood pressure improved patient received Zofran, was having hives with concern for anaphylaxis and received epi inj  Pepcid Benadryl and Decadron. In the ED afebrile BP stable not hypoxic labs show significant leukocytosis elevation in troponin 19> 42, lactic acid is 4.1 Also suspected to have asthma, cardiology and pulmonary were consulted and transferred to [facility]. Seen by cardiology EKG nonischemic chest pain reproducible with deep palpation in intercostal area, low suspicion for ischemic heart disease CT was negative for aortic atherosclerosis and coronary artery calcification likely musculoskeletal, cardiology advised outpatient coronary CTA if she continues to have chest pain and they will arrange outpatient follow-up.Lvef 70-75%,No RWMA,g1dd.She is resting well no new complaints, on RA.

## 2022-12-28 NOTE — Progress Notes (Signed)
PROGRESS NOTE Brenda Barker  DDU:[id number] DOB: Jul 16, 1981 DOA: 12/27/2022 PCP: Pcp, No  Brief Narrative/Hospital Course: 42 year old female with no significant past medical history presented with chest pain and severe dyspnea after running on the treadmill in the gym.Reportedly blood pressure was low at the gym, sent to the ED, and route blood pressure improved patient received Zofran, was having hives with concern for anaphylaxis and received epi inj  Pepcid Benadryl and Decadron. In the ED afebrile BP stable not hypoxic labs show significant leukocytosis elevation in troponin 19> 42, lactic acid is 4.1 Also suspected to have asthma, cardiology and pulmonary were consulted and transferred to [facility]. Seen by cardiology EKG nonischemic chest pain reproducible with deep palpation in intercostal area, low suspicion for ischemic heart disease CT was negative for aortic atherosclerosis and coronary artery calcification likely musculoskeletal, cardiology advised outpatient coronary CTA if she continues to have chest pain and they will arrange outpatient follow-up.Lvef 70-75%,No RWMA,g1dd.She is resting well no new complaints, on RA.    Subjective: Alert awake resting comfortably overall feels much improved, not on oxygen   Assessment and Plan: Principal Problem:   Elevated troponin   Respiratory distress Possible exercise-induced asthma Bilateral small airway disease with bronchial wall thickening Suspected pulmonary hypertension: Patient with respiratory distress chest pain dyspnea following treadmill.CTA neg for PE, showed Enlarged main pulmonary artery question pulmonary hypertension,bilateral lower lobe bronchial wall thickening cyst small with airway disease.  At this time distress resolved currently doing well on room air mild chest pain is reproducible on palpation will need outpatient follow-up with cardiology.?Pulmonary hypertension follow-up echo unremarkable.Per pulm less concerned  about the asthma they will arrange op methacholine challenge test and OP PFT, fu in office.Discussed w/ Agarwal/Dr. C with cardiology given presentation with elevated troponin, less suspicion of pulmonary etiology, planning for CT coronaries tomorrow.  Elevated troponin 19-42-88: Cardiology has been consulted unclear etiology could from allergic reaction mediated BP issuEs,follow-up echocardiogram showed EF 70 to 75%, G1 DD. See above.  Allergic rhinitis follow-up with ENT Leukocytosis initially 26.8 improving 19.2, negative procalcitonin less than 0.1 no fever likely reactive also received Decadron. Elevated D-dimer 2.4 but CT angio chest negative for PE Lactic acidosis/metabolic acidosis likely from respiratory distress resolved Hyperlipidemia continue exercise and diet modification for 6 months before starting statins. Prolonged QTc monitor keep mag above 2 and potassium above 4 Allergic reaction to Zofran:Reportedly she had hives after Zofran patient was treated with EpiPen, Decadron Pepcid and Benadryl.  Allergy list updated. Hyperglycemia: pending HbA1c.Follow-up with PCP.  DVT prophylaxis: enoxaparin (LOVENOX) injection 40 mg Start: 12/28/22 1000 Code Status:   Code Status: Full Code Family Communication: plan of care discussed with patient at bedside. Patient status is:  admitted as observation but remains hospitalized for ongoing  because of further workup Level of care: Telemetry Cardiac   Dispo: The patient is from: home            Anticipated disposition: home tomorrow Objective: Vitals last 24 hrs: Vitals:   12/28/22 0242 12/28/22 0502 12/28/22 0738 12/28/22 1617  BP: (!) 147/78  139/73 (!) 141/88  Pulse: 65  60 66  Resp: 18  18 18   Temp: 97.7 F (36.5 C)  97.9 F (36.6 C) 98 F (36.7 C)  TempSrc: Oral  Axillary Oral  SpO2: 100%  100% 100%  Weight:  76.9 kg    Height:  5\' 6"  (1.676 m)     Weight change:   Physical Examination: General exam: alert awake, older than  stated age HEENT:Oral mucosa moist, Ear/Nose WNL grossly Respiratory system: bilaterally clear BS, no use of accessory muscle Cardiovascular system: S1 & S2 +, No JVD. Gastrointestinal system: Abdomen soft,NT,ND, BS+ Nervous System:Alert, awake, moving extremities. Extremities: LE edema neg,distal peripheral pulses palpable.  Skin: No rashes,no icterus. MSK: Normal muscle bulk,tone, power  Medications reviewed:  Scheduled Meds:  enoxaparin (LOVENOX) injection  40 mg Subcutaneous Q24H   insulin aspart  0-9 Units Subcutaneous Q4H  Continuous Infusions:  Diet Order             Diet regular Fluid consistency: Thin  Diet effective now                 No intake or output data in the 24 hours ending 12/28/22 1717 Net IO Since Admission: No IO data has been entered for this period [12/28/22 1717]  Wt Readings from Last 3 Encounters:  12/28/22 76.9 kg     Unresulted Labs (From admission, onward)     Start     Ordered   12/28/22 0500  Hemoglobin A1c  Tomorrow morning,   R        12/28/22 0022   12/28/22 0209  Culture, blood (Routine X 2) w Reflex to ID Panel  BLOOD CULTURE X 2,   R (with TIMED occurrences)     Question:  Patient immune status  Answer:  Normal   12/28/22 0208   12/28/22 0208  Urinalysis, w/ Reflex to Culture (Infection Suspected) -Urine, Clean Catch  (Urine Labs)  ONCE - URGENT,   URGENT       Question:  Specimen Source  Answer:  Urine, Clean Catch   12/28/22 0207          Data Reviewed: I have personally reviewed following labs and imaging studies CBC: Recent Labs  Lab 12/27/22 2035 12/28/22 0115  WBC 26.8* 19.2*  NEUTROABS 20.8*  --   HGB 14.5 12.5  HCT 42.6 36.8  MCV 97.9 97.4  PLT 333 279   Basic Metabolic Panel: Recent Labs  Lab 12/27/22 2035 12/28/22 0115 12/28/22 0414  NA 136 133*  --   K 3.6 4.2  --   CL 100 102  --   CO2 24 20*  --   GLUCOSE 196* 170*  --   BUN 15 12  --   CREATININE 0.92 0.95 0.84  CALCIUM 9.0 8.8*  --   MG  --   1.8  --   PHOS  --  3.4  --   [id number] Creatinine Clearance: 89.5 mL/min (by C-G formula based on SCr of 0.84 mg/dL). Liver Function Tests: Recent Labs  Lab 12/27/22 2035  AST 23  ALT 19  ALKPHOS 50  BILITOT 0.8  PROT 7.8  ALBUMIN 4.3  CBG: Recent Labs  Lab 12/28/22 0040 12/28/22 0455 12/28/22 0745 12/28/22 1204 12/28/22 1615  GLUCAP 180* 146* 130* 101* 131*  Lipid Profile: Recent Labs    12/28/22 0115  CHOL 218*  HDL 54  LDLCALC 150*  TRIG 68  CHOLHDL 4.0   Recent Labs  Lab 12/28/22 0115 12/28/22 0414 12/28/22 0430  PROCALCITON  --   --  <0.10  LATICACIDVEN 4.1* 1.4  --     Recent Results (from the past 240 hour(s))  SARS Coronavirus 2 by RT PCR (hospital order, performed in [facility] hospital lab) *cepheid single result test* Anterior Nasal Swab     Status: None   Collection Time: 12/28/22  1:16 AM   Specimen: Anterior Nasal Swab  Result Value Ref Range Status   SARS Coronavirus 2 by RT PCR NEGATIVE NEGATIVE Final    Comment: (NOTE) SARS-CoV-2 target nucleic acids are NOT DETECTED.  The SARS-CoV-2 RNA is generally detectable in upper and lower respiratory specimens during the acute phase of infection. The lowest concentration of SARS-CoV-2 viral copies this assay can detect is 250 copies / mL. A negative result does not preclude SARS-CoV-2 infection and should not be used as the sole basis for treatment or other patient management decisions.  A negative result may occur with improper specimen collection / handling, submission of specimen other than nasopharyngeal swab, presence of viral mutation(s) within the areas targeted by this assay, and inadequate number of viral copies (<250 copies / mL). A negative result must be combined with clinical observations, patient history, and epidemiological information.  Fact Sheet for Patients:   RoadLapTop.co.za  Fact Sheet for Healthcare  Providers: http://kim-miller.com/  This test is not yet approved or  cleared by the [place] FDA and has been authorized for detection and/or diagnosis of SARS-CoV-2 by FDA under an Emergency Use Authorization (EUA).  This EUA will remain in effect (meaning this test can be used) for the duration of the COVID-19 declaration under Section 564(b)(1) of the Act, 21 U.S.C. section 360bbb-3(b)(1), unless the authorization is terminated or revoked sooner.  Performed at [facility], [address] [place], [place] [address]   Respiratory (~20 pathogens) panel by PCR     Status: None   Collection Time: 12/28/22  1:17 AM   Specimen: Anterior Nasal Swab; Respiratory  Result Value Ref Range Status   Adenovirus NOT DETECTED NOT DETECTED Final   Coronavirus 229E NOT DETECTED NOT DETECTED Final    Comment: (NOTE) The Coronavirus on the Respiratory Panel, DOES NOT test for the novel  Coronavirus (2019 nCoV)    Coronavirus HKU1 NOT DETECTED NOT DETECTED Final   Coronavirus NL63 NOT DETECTED NOT DETECTED Final   Coronavirus OC43 NOT DETECTED NOT DETECTED Final   Metapneumovirus NOT DETECTED NOT DETECTED Final   Rhinovirus / Enterovirus NOT DETECTED NOT DETECTED Final   Influenza A NOT DETECTED NOT DETECTED Final   Influenza B NOT DETECTED NOT DETECTED Final   Parainfluenza Virus 1 NOT DETECTED NOT DETECTED Final   Parainfluenza Virus 2 NOT DETECTED NOT DETECTED Final   Parainfluenza Virus 3 NOT DETECTED NOT DETECTED Final   Parainfluenza Virus 4 NOT DETECTED NOT DETECTED Final   Respiratory Syncytial Virus NOT DETECTED NOT DETECTED Final   Bordetella pertussis NOT DETECTED NOT DETECTED Final   Bordetella Parapertussis NOT DETECTED NOT DETECTED Final   Chlamydophila pneumoniae NOT DETECTED NOT DETECTED Final   Mycoplasma pneumoniae NOT DETECTED NOT DETECTED Final    Comment: Performed at [facility], [address], [place], [place] [address]     Antimicrobials: Anti-infectives (From admission, onward)    None      Culture/Microbiology No results found for: "SDES", "SPECREQUEST", "CULT", "REPTSTATUS"  Radiology Studies: ECHOCARDIOGRAM COMPLETE  Result Date: 12/28/2022    ECHOCARDIOGRAM REPORT   Patient Name:   [facility] Hirschman Date of Exam: 12/28/2022 Medical Rec #:  [id number]      Height:       66.0 in Accession #:    [id number]     Weight:       169.5 lb Date of Birth:  [birth year]       BSA:          1.864 m Patient Age:  42 years       BP:           139/73 mmHg Patient Gender: F              HR:           59 bpm. Exam Location:  Inpatient Procedure: 2D Echo, Color Doppler, Cardiac Doppler, Strain Analysis and            Intracardiac Opacification Agent Indications:    Elevated Troponins  History:        Patient has no prior history of Echocardiogram examinations.                 Risk Factors:Hypertension.  Sonographer:    Irving Burton Senior RDCS Referring Phys: [id number] Oliver Pila HALL  Sonographer Comments: Definity used to assess for apical variant HCM IMPRESSIONS  1. Left ventricular ejection fraction, by estimation, is 70 to 75%. The left ventricle has normal function. The left ventricle has no regional wall motion abnormalities. There is mild concentric left ventricular hypertrophy. Left ventricular diastolic parameters are consistent with Grade I diastolic dysfunction (impaired relaxation). The average left ventricular global longitudinal strain is -19.9 %. The global longitudinal strain is normal.  2. Right ventricular systolic function is normal. The right ventricular size is normal. Tricuspid regurgitation signal is inadequate for assessing PA pressure.  3. Left atrial size was mild to moderately dilated.  4. Right atrial size was mildly dilated.  5. The mitral valve is normal in structure. Trivial mitral valve regurgitation. No evidence of mitral stenosis.  6. The aortic valve is tricuspid. Aortic valve regurgitation is not visualized. No  aortic stenosis is present.  7. The inferior vena cava is normal in size with <50% respiratory variability, suggesting right atrial pressure of 8 mmHg. FINDINGS  Left Ventricle: Left ventricular ejection fraction, by estimation, is 70 to 75%. The left ventricle has normal function. The left ventricle has no regional wall motion abnormalities. Definity contrast agent was given IV to delineate the left ventricular  endocardial borders. The average left ventricular global longitudinal strain is -19.9 %. The global longitudinal strain is normal. The left ventricular internal cavity size was normal in size. There is mild concentric left ventricular hypertrophy. Left ventricular diastolic parameters are consistent with Grade I diastolic dysfunction (impaired relaxation). Normal left ventricular filling pressure. Right Ventricle: The right ventricular size is normal. No increase in right ventricular wall thickness. Right ventricular systolic function is normal. Tricuspid regurgitation signal is inadequate for assessing PA pressure. The tricuspid regurgitant velocity is 2.00 m/s, and with an assumed right atrial pressure of 8 mmHg, the estimated right ventricular systolic pressure is 24.0 mmHg. Left Atrium: Left atrial size was mild to moderately dilated. Right Atrium: Right atrial size was mildly dilated. Pericardium: There is no evidence of pericardial effusion. Mitral Valve: The mitral valve is normal in structure. Trivial mitral valve regurgitation. No evidence of mitral valve stenosis. Tricuspid Valve: The tricuspid valve is normal in structure. Tricuspid valve regurgitation is trivial. No evidence of tricuspid stenosis. Aortic Valve: The aortic valve is tricuspid. Aortic valve regurgitation is not visualized. No aortic stenosis is present. Pulmonic Valve: The pulmonic valve was normal in structure. Pulmonic valve regurgitation is trivial. No evidence of pulmonic stenosis. Aorta: The aortic root and ascending aorta are  structurally normal, with no evidence of dilitation. Venous: The inferior vena cava is normal in size with less than 50% respiratory variability, suggesting right atrial pressure of 8 mmHg. IAS/Shunts: No atrial level  shunt detected by color flow Doppler.  LEFT VENTRICLE PLAX 2D LVIDd:         4.50 cm   Diastology LVIDs:         2.70 cm   LV e' medial:    9.46 cm/s LV PW:         0.90 cm   LV E/e' medial:  7.5 LV IVS:        0.90 cm   LV e' lateral:   10.70 cm/s LVOT diam:     1.90 cm   LV E/e' lateral: 6.7 LV SV:         61 LV SV Index:   33        2D Longitudinal Strain LVOT Area:     2.84 cm  2D Strain GLS (A2C):   -21.4 %                          2D Strain GLS (A3C):   -16.3 %                          2D Strain GLS (A4C):   -21.9 %                          2D Strain GLS Avg:     -19.9 % RIGHT VENTRICLE RV S prime:     12.40 cm/s TAPSE (M-mode): 2.4 cm LEFT ATRIUM             Index        RIGHT ATRIUM           Index LA diam:        2.70 cm 1.45 cm/m   RA Area:     19.80 cm LA Vol (A2C):   60.9 ml 32.67 ml/m  RA Volume:   55.00 ml  29.50 ml/m LA Vol (A4C):   62.9 ml 33.74 ml/m LA Biplane Vol: 62.3 ml 33.42 ml/m  AORTIC VALVE LVOT Vmax:   110.00 cm/s LVOT Vmean:  69.300 cm/s LVOT VTI:    0.214 m  AORTA Ao Root diam: 2.70 cm Ao Asc diam:  3.20 cm MITRAL VALVE               TRICUSPID VALVE MV Area (PHT): 2.72 cm    TR Peak grad:   16.0 mmHg MV Decel Time: 279 msec    TR Vmax:        200.00 cm/s MV E velocity: 71.30 cm/s MV A velocity: 81.80 cm/s  SHUNTS MV E/A ratio:  0.87        Systemic VTI:  0.21 m                            Systemic Diam: 1.90 cm Chilton Si MD Electronically signed by Chilton Si MD Signature Date/Time: 12/28/2022/12:26:08 PM    Final    CT Angio Chest PE W and/or Wo Contrast  Result Date: 12/27/2022 CLINICAL DATA:  Pulmonary embolism (PE) suspected, high prob EXAM: CT ANGIOGRAPHY CHEST WITH CONTRAST TECHNIQUE: Multidetector CT imaging of the chest was performed using the  standard protocol during bolus administration of intravenous contrast. Multiplanar CT image reconstructions and MIPs were obtained to evaluate the vascular anatomy. RADIATION DOSE REDUCTION: This exam was performed according to the departmental dose-optimization program which includes automated exposure control, adjustment of the mA  and/or kV according to patient size and/or use of iterative reconstruction technique. CONTRAST:  80mL OMNIPAQUE IOHEXOL 350 MG/ML SOLN COMPARISON:  Chest x-ray 12/27/2022 FINDINGS: Cardiovascular: Satisfactory opacification of the pulmonary arteries to the segmental level. No evidence of pulmonary embolism. The main pulmonary artery is enlarged measuring up to 3.3 cm. Prominent heart size. No significant pericardial effusion. The thoracic aorta is normal in caliber. No atherosclerotic plaque of the thoracic aorta. No coronary artery calcifications. Mediastinum/Nodes: No enlarged mediastinal, hilar, or axillary lymph nodes. Thyroid gland, trachea, and esophagus demonstrate no significant findings. Lungs/Pleura: Bilateral lower lobe bronchial wall thickening, right greater than left. Bibasilar atelectasis. No focal consolidation. No pulmonary nodule. No pulmonary mass. No pleural effusion. No pneumothorax. Upper Abdomen: No acute abnormality. Musculoskeletal: No chest wall abnormality. No suspicious lytic or blastic osseous lesions. No acute displaced fracture. Review of the MIP images confirms the above findings. IMPRESSION: 1. No pulmonary embolus. 2. Enlarged main pulmonary artery-correlate for pulmonary hypertension. 3. Bilateral lower lobe bronchial wall thickening suggestive of small airway disease. Electronically Signed   By: Tish Frederickson M.D.   On: 12/27/2022 22:21   DG Chest Port 1 View  Result Date: 12/27/2022 CLINICAL DATA:  Hypotension and shortness of breath EXAM: PORTABLE CHEST 1 VIEW COMPARISON:  None Available. FINDINGS: The heart size and mediastinal contours are  within normal limits. Both lungs are clear. The visualized skeletal structures are unremarkable. IMPRESSION: No active disease. Electronically Signed   By: Alcide Clever M.D.   On: 12/27/2022 20:26     LOS: 0 days   Lanae Boast, MD [facility]  12/28/2022, 5:17 PM

## 2022-12-28 NOTE — ED Notes (Addendum)
Josh RN given report and [facility] called for transportation.

## 2022-12-28 NOTE — Consult Note (Addendum)
NAMESerendipity Barker, MRN:  [id number], DOB:  November 20, 1980, LOS: 0 ADMISSION DATE:  12/27/2022, CONSULTATION DATE:  12/28/2022 REFERRING MD:  Dr. Margo Barker, CHIEF COMPLAINT:  Dyspnea with chest pain  History of Present Illness:  Brenda Barker is a 42 yo female with PMHx of allergic rhinitis, right nasal deviation, pre-eclampsia, and seizures (last at 42 yo) who presented to [facility] ED via EMS from [employer] due to severe dyspnea and central, burning chest pain with nausea after 20 minutes of running on treadmill. On EMS arrival, dyspneic and hypotensive. Received IV Zofran and IV fluid en route, fluid responsive. Arrived at ED with hives/concern for anaphylaxis, prompting IM epi 0.3 x1, IV benadryl 50mg  x1, IV Decadron 10mg  x1, and IV pepcid 20mg  x1. Remained HDS.  ED evaluation pertinent for WBC 26.8, glucose 106, trop 19 > 42, d-dimer 2.49, and CTA chest w/o PE but +bilateral lower lobe bronchial wall thickening and enlarged pulm artery. EDP discussed with cardiology who transferred to [facility] for further evaluation. PCCM consulted for further evaluation of dyspnea.   In PCCM interview, endorses previously working out 4-5x week, including running in gym/outside, occasionally getting "overexerted." SOB normally resolves after 1-2 minutes of rest and normally can continue workout. Did not exercise for >6 months before this due to schedule. First time having chest pain/nausea. No hx asthma, respiratory disease, or eczema; however long history of seasonal allergies. Describes baseline of daily rhinitis, congestion, ear/throat discomfort mostly mitigated by daily Claritin. Also reports episodic "wheezing" at night further described as progressive nasal blocking/whistling. Previously evaluated by ENT in 2018 - dx with severely deviated R nasal septum and turbinate hypertrophy, recommended septoplasty and turbinate reduction. Occasionally smokes marijuana (usually <1x month) and hookah socially (1-2x year,  last on Saturday). Denies any cigarette use or second hand smoke in home. No sick contacts, known environmental exposure, or OTC use. 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  Mother at bedside diagnosed with adult-onset asthma ~8 years ago, controlled by [place]. No other pertinent family history.   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 Resolved with rest. Has been NPO for cardiology. Rates chest discomfort as 2-3/10. Denies nasal congestion, sore throat, dyspnea, and other sxs.  Objective   Blood pressure 139/73, pulse 60, temperature 97.9 F (36.6 C), temperature source Axillary, resp. rate 18, height 5\' 6"  (1.676 m), weight 76.9 kg, SpO2 100 %.       No intake or output data in the 24 hours ending 12/28/22 0911 Filed Weights   12/28/22 0502  Weight: 76.9 kg    Examination: General: Well-appearing 42 yo, in NAD, lying in hospital bed, conversant HENT: NCAT, +cobblestoning posterior pharynx, +rhinitis, +L submaxillary LAD Lungs: Normal WOB, CTAB, no wheezes, rhonchi, or rales Cardiovascular: RRR, no murmurs, rubs, or gallops Abdomen: soft, nontender, non distended Extremities: No edema, cyanosis, skin lesions or rashes.  Neuro: A&Ox3, no focal deficits, CN II-XII grossly intact  Ancillary tests reviewed by me:  RPP negative WBC 19.2 Troponin 88 Na  133 Glucose 130 Lactic acid 1.4, procalc <0.1 Echocardiogram unremarkable  Assessment & Plan:   Dyspnea on exertion, resolved with rest  Currently asymptomatic, no wheezing on exam. Has not required DuoNebs since admission. CTA with enlarged main pulm artery and bilateral lower lobe bronchial wall thickening concerning for small airway disease. Echocardiogram unremarkable. RPP negative; no infectious sxs. Known chronic nasal airway obstruction with nasal septum deviation likely contributes to respiratory symptoms and patient is at risk for allergy-induced asthma with atopy; however, acute episode of DOE with angina feels unlikely to be exercise-induced asthma or pulmonary etiology. Low concern for underlying asthma presentation given age and acuity, but reasonable to evaluate with primary care.  - Pulmonary function test in outpatient setting - Albuterol rescue inhaler for wheezing  Allergic rhinitis  R nasal septum deviation  Extensive history of seasonal allergies. Previously evaluated by ENT in 2018 - dx with severely deviated R nasal septum and turbinate hypertrophy, recommended septoplasty and turbinate reduction. - Continue home Claritin daily - Fluticasone 2 sprays qd and nasal saline spray as needed - Further follow up with ENT after hospitalization - Recommend allergy testing in outpatient setting  Leukocytosis  Hives  Allergic reaction to Zofran WBC 19.2. Procalc and repeat lactic acid wnl. Likely secondary to acute stress vs tx of steroids for allergic reaction. Hives have since resolved s/p treatment in ED. - CTM  Elevated troponin  Elevated d-dimer Trop 19 > 42 > 88. EKG and echocardiogram unremarkable. D-dimer elevated but admitting CTA w/o PE. Not at high risk for VTE or atherosclerotic event given age, activity level, or known comorbidities.  - Cardiology consulted; considering trop rise possibly secondary to hypotension during allergic response with low suspicion for coronary event.   Rest per primary  Best Practice (right click and "Reselect all SmartList  Selections" daily)   Diet/type: NPO DVT prophylaxis: prophylactic heparin  GI prophylaxis: N/A Lines: N/A Foley:  N/A Code Status:  full code  Labs   CBC: Recent Labs  Lab 12/27/22 2035 12/28/22 0115  WBC 26.8* 19.2*  NEUTROABS 20.8*  --   HGB 14.5 12.5  HCT 42.6 36.8  MCV 97.9 97.4  PLT 333 279    Basic Metabolic Panel: Recent Labs  Lab 12/27/22 2035 12/28/22 0115 12/28/22 0414  NA 136 133*  --   K 3.6 4.2  --   CL 100 102  --   CO2 24 20*  --   GLUCOSE 196* 170*  --   BUN 15 12  --   CREATININE 0.92 0.95 0.84  CALCIUM 9.0 8.8*  --   MG  --  1.8  --   PHOS  --  3.4  --    GFR: Estimated Creatinine Clearance: 89.5 mL/min (by C-G formula based on SCr of 0.84 mg/dL). Recent Labs  Lab 12/27/22 2035 12/28/22 0115 12/28/22 0414 12/28/22 0430  PROCALCITON  --   --   --  <0.10  WBC 26.8* 19.2*  --   --   LATICACIDVEN  --  4.1* 1.4  --     Liver Function Tests: Recent Labs  Lab 12/27/22 2035  AST 23  ALT 19  ALKPHOS 50  BILITOT 0.8  PROT 7.8  ALBUMIN 4.3   No results for input(s): "LIPASE", "AMYLASE" in the last 168 hours. No results for input(s): "AMMONIA" in the last 168 hours.  ABG No results found for: "PHART", "PCO2ART", "PO2ART", "HCO3", "TCO2", "ACIDBASEDEF", "O2SAT"   Coagulation Profile: No results for input(s): "INR", "PROTIME" in  the last 168 hours.  Cardiac Enzymes: Recent Labs  Lab 12/27/22 2035  CKTOTAL 84    HbA1C: No results found for: "HGBA1C"  CBG: Recent Labs  Lab 12/28/22 0040 12/28/22 0455 12/28/22 0745  GLUCAP 180* 146* 130*    Past Medical History:  She,  has no past medical history on file.   Surgical History:  Appendectomy; tonsillectomy; partial hysterectomy    Social History:      Family History:  Her family history is not on file.   Allergies Allergies  Allergen Reactions   Zofran [Ondansetron] Hives, Shortness Of Breath, Rash and Other (See Comments)    Skin burning, also (had to receive  an EpiPen to reverse the allergic reaction)   Soy Allergy Nausea And Vomiting     Home Medications  Prior to Admission medications   Medication Sig Start Date End Date Taking? Authorizing Provider  CLARITIN 10 MG tablet Take 10 mg by mouth daily.   Yes [provider]  TYLENOL 500 MG tablet Take 500-1,000 mg by mouth every 6 (six) hours as needed for mild pain or headache.   Yes [provider]    Adolph Pollack, MS4

## 2022-12-28 NOTE — Progress Notes (Signed)
At the recommendation of Dr. Royann Shivers, will set up coronary CT tomorrow. I have called radiology CT department and confirmed they have a nurse tomorrow who can do coronary CT. Order placed. Informed nurse to place 18 gauge IV. Order for PRN metoprolol tartrate 50mg  placed to be given 2 hours prior to coronary CT. Informed [employer] our APP this weekend to administer sublingual nitro. Sent staff message to CT readers.

## 2022-12-28 NOTE — Progress Notes (Signed)
Echocardiogram 2D Echocardiogram has been performed.  Warren Lacy Jakita Dutkiewicz RDCS 12/28/2022, 9:33 AM

## 2022-12-28 NOTE — Plan of Care (Signed)
  Problem: Health Behavior/Discharge Planning: Goal: Ability to identify and utilize available resources and services will improve Outcome: Progressing Goal: Ability to manage health-related needs will improve Outcome: Progressing   Problem: Education: Goal: Knowledge of General Education information will improve Description: Including pain rating scale, medication(s)/side effects and non-pharmacologic comfort measures Outcome: Progressing   Problem: Health Behavior/Discharge Planning: Goal: Ability to manage health-related needs will improve Outcome: Progressing   Problem: Clinical Measurements: Goal: Cardiovascular complication will be avoided Outcome: Progressing   Problem: Safety: Goal: Ability to remain free from injury will improve Outcome: Progressing

## 2022-12-28 NOTE — Plan of Care (Signed)
  Problem: Education: Goal: Ability to describe self-care measures that may prevent or decrease complications (Diabetes Survival Skills Education) will improve Outcome: Progressing   Problem: Fluid Volume: Goal: Ability to maintain a balanced intake and output will improve Outcome: Progressing   Problem: Health Behavior/Discharge Planning: Goal: Ability to identify and utilize available resources and services will improve Outcome: Progressing Goal: Ability to manage health-related needs will improve Outcome: Progressing   Problem: Nutritional: Goal: Maintenance of adequate nutrition will improve Outcome: Progressing Goal: Progress toward achieving an optimal weight will improve Outcome: Progressing   Problem: Skin Integrity: Goal: Risk for impaired skin integrity will decrease Outcome: Progressing   Problem: Education: Goal: Knowledge of General Education information will improve Description: Including pain rating scale, medication(s)/side effects and non-pharmacologic comfort measures Outcome: Progressing   Problem: Health Behavior/Discharge Planning: Goal: Ability to manage health-related needs will improve Outcome: Progressing   Problem: Clinical Measurements: Goal: Ability to maintain clinical measurements within normal limits will improve Outcome: Progressing Goal: Will remain free from infection Outcome: Progressing Goal: Respiratory complications will improve Outcome: Progressing   Problem: Activity: Goal: Risk for activity intolerance will decrease Outcome: Progressing   Problem: Nutrition: Goal: Adequate nutrition will be maintained Outcome: Progressing   Problem: Skin Integrity: Goal: Risk for impaired skin integrity will decrease Outcome: Progressing

## 2022-12-28 NOTE — Consult Note (Addendum)
Cardiology Consultation   Patient ID: Brenda Barker MRN: [id number]; DOB: May 12, 1981  Admit date: 12/27/2022 Date of Consult: 12/28/2022  PCP:  Oneita Hurt No   [facility] Providers Cardiologist:  Thurmon Fair, MD   Patient Profile:   Brenda Barker is a 42 y.o. female with no prior cardiac history, hx of pre-eclampsia and seizures (last seizure when 42 yo) who is being seen 12/28/2022 for the evaluation of chest pain, elevated troponin at the request of Dr. Jonathon Bellows.  History of Present Illness:   [name] was exercising at the gym and experienced sudden onset of shortness of breath and subcentral chest pain after running on the treadmill. She did not feel well and stopped exercising. EMS dispatched and found she was hypotensive on arrival, improved with 500 cc bolus. She also received zofran with EMS. On arrival to the ER, she developed hives. Due to concern for anaphylaxis, she received IM epi x 1, IV Pepcid 20 mg, IV benadryl 50 mg, and IV decadron 10 mg.   With further questioning she reported difficulty sleeping with wheezing at night over the last week. She does not use tobacco products but occasionally smokes marijuana.   Pulmonology was consulted for PFT and possible undiagnosed adult onset asthma.   BP 129/81, HR 66, O2 100% on room air, temp 98.3 WBC 26.8 HS troponin 19 --> 42 --> 88  Given chest pain and elevated tropnonin she was given ASA 325 mg.   During my interview, she states she had not been to the gym in 6 months, yesterday was her first day back. She stretched for 3 min and then ran on the treadmill. She had to stop her workout for sudden severe SOB with subsequent chest discomfort - she describes this as a discomfort rather than pain, and felt like a pressure on her chest. This was associated with nausea, which lead to zofran administration.   She works at [employer]. She has a 44 yo autistic daughter at home. She has had a partial  hysterectomy, ovaries intact, no HRT. She is a nonsmoker. No family history of premature  heart disease.    History reviewed. No pertinent past medical history.  Home Medications:  Prior to Admission medications   Medication Sig Start Date End Date Taking? Authorizing Provider  CLARITIN 10 MG tablet Take 10 mg by mouth daily.   Yes [provider]  TYLENOL 500 MG tablet Take 500-1,000 mg by mouth every 6 (six) hours as needed for mild pain or headache.   Yes [provider]    Inpatient Medications: Scheduled Meds:  enoxaparin (LOVENOX) injection  40 mg Subcutaneous Q24H   insulin aspart  0-9 Units Subcutaneous Q4H   Continuous Infusions:  PRN Meds: acetaminophen, ipratropium-albuterol, melatonin, nitroGLYCERIN, perflutren lipid microspheres (DEFINITY) IV suspension, polyethylene glycol, prochlorperazine  Allergies:    Allergies  Allergen Reactions   Zofran [Ondansetron] Hives, Shortness Of Breath, Rash and Other (See Comments)    Skin burning, also (had to receive an EpiPen to reverse the allergic reaction)   Soy Allergy Nausea And Vomiting    Social History:   Social History   Socioeconomic History   Marital status: Single    Spouse name: Not on file   Number of children: Not on file   Years of education: Not on file   Highest education level: Not on file  Occupational History   Not on file  Tobacco Use   Smoking status: Not on file  Smokeless tobacco: Not on file  Substance and Sexual Activity   Alcohol use: Not on file   Drug use: Not on file   Sexual activity: Not on file  Other Topics Concern   Not on file  Social History Narrative   Not on file   Social Determinants of Health   Financial Resource Strain: Not on file  Food Insecurity: No Food Insecurity (12/28/2022)   Hunger Vital Sign    Worried About Running Out of Food in the Last Year: Never true    Ran Out of Food in the Last Year: Never true  Transportation Needs: No  Transportation Needs (12/28/2022)   PRAPARE - Administrator, Civil Service (Medical): No    Lack of Transportation (Non-Medical): No  Physical Activity: Not on file  Stress: Not on file  Social Connections: Not on file  Intimate Partner Violence: Not At Risk (12/28/2022)   Humiliation, Afraid, Rape, and Kick questionnaire    Fear of Current or Ex-Partner: No    Emotionally Abused: No    Physically Abused: No    Sexually Abused: No    Family History:   No family history on file.   ROS:  Please see the history of present illness.   All other ROS reviewed and negative.     Physical Exam/Data:   Vitals:   12/27/22 2351 12/28/22 0242 12/28/22 0502 12/28/22 0738  BP: 134/70 (!) 147/78  139/73  Pulse: 65 65  60  Resp: 16 18  18   Temp: 98.3 F (36.8 C) 97.7 F (36.5 C)  97.9 F (36.6 C)  TempSrc: Oral Oral  Axillary  SpO2: 100% 100%  100%  Weight:   76.9 kg   Height:   5\' 6"  (1.676 m)    No intake or output data in the 24 hours ending 12/28/22 1019    12/28/2022    5:02 AM  Last 3 Weights  Weight (lbs) 169 lb 8.5 oz  Weight (kg) 76.9 kg     Body mass index is 27.36 kg/m.  General:  Well nourished, well developed, in no acute distress HEENT: normal Neck: no JVD Vascular: No carotid bruits; Distal pulses 2+ bilaterally Cardiac:  normal S1, S2; RRR; no murmur  Lungs:  clear to auscultation bilaterally, no wheezing, rhonchi or rales  Abd: soft, nontender, no hepatomegaly  Ext: no edema Musculoskeletal:  No deformities, BUE and BLE strength normal and equal Skin: warm and dry  Neuro:  CNs 2-12 intact, no focal abnormalities noted Psych:  Normal affect   EKG:  The EKG was personally reviewed and demonstrates:  sinus rhythm with HR 66 Telemetry:  Telemetry was personally reviewed and demonstrates:  sinus rhythm in the 60s  Relevant CV Studies:  Echo today - final read pending  Laboratory Data:  High Sensitivity Troponin:   Recent Labs  Lab  12/27/22 2035 12/27/22 2145 12/28/22 0115  TROPONINIHS 19* 42* 88*     Chemistry Recent Labs  Lab 12/27/22 2035 12/28/22 0115 12/28/22 0414  NA 136 133*  --   K 3.6 4.2  --   CL 100 102  --   CO2 24 20*  --   GLUCOSE 196* 170*  --   BUN 15 12  --   CREATININE 0.92 0.95 0.84  CALCIUM 9.0 8.8*  --   MG  --  1.8  --   GFRNONAA >60 >60 >60  ANIONGAP 12 11  --     Recent Labs  Lab  12/27/22 2035  PROT 7.8  ALBUMIN 4.3  AST 23  ALT 19  ALKPHOS 50  BILITOT 0.8   Lipids  Recent Labs  Lab 12/28/22 0115  CHOL 218*  TRIG 68  HDL 54  LDLCALC 150*  CHOLHDL 4.0    Hematology Recent Labs  Lab 12/27/22 2035 12/28/22 0115  WBC 26.8* 19.2*  RBC 4.35 3.78*  HGB 14.5 12.5  HCT 42.6 36.8  MCV 97.9 97.4  MCH 33.3 33.1  MCHC 34.0 34.0  RDW 11.6 11.8  PLT 333 279   Thyroid No results for input(s): "TSH", "FREET4" in the last 168 hours.  BNP Recent Labs  Lab 12/27/22 2035  BNP 18.0    DDimer  Recent Labs  Lab 12/27/22 2120  DDIMER 2.49*     Radiology/Studies:  CT Angio Chest PE W and/or Wo Contrast  Result Date: 12/27/2022 CLINICAL DATA:  Pulmonary embolism (PE) suspected, high prob EXAM: CT ANGIOGRAPHY CHEST WITH CONTRAST TECHNIQUE: Multidetector CT imaging of the chest was performed using the standard protocol during bolus administration of intravenous contrast. Multiplanar CT image reconstructions and MIPs were obtained to evaluate the vascular anatomy. RADIATION DOSE REDUCTION: This exam was performed according to the departmental dose-optimization program which includes automated exposure control, adjustment of the mA and/or kV according to patient size and/or use of iterative reconstruction technique. CONTRAST:  80mL OMNIPAQUE IOHEXOL 350 MG/ML SOLN COMPARISON:  Chest x-ray 12/27/2022 FINDINGS: Cardiovascular: Satisfactory opacification of the pulmonary arteries to the segmental level. No evidence of pulmonary embolism. The main pulmonary artery is enlarged  measuring up to 3.3 cm. Prominent heart size. No significant pericardial effusion. The thoracic aorta is normal in caliber. No atherosclerotic plaque of the thoracic aorta. No coronary artery calcifications. Mediastinum/Nodes: No enlarged mediastinal, hilar, or axillary lymph nodes. Thyroid gland, trachea, and esophagus demonstrate no significant findings. Lungs/Pleura: Bilateral lower lobe bronchial wall thickening, right greater than left. Bibasilar atelectasis. No focal consolidation. No pulmonary nodule. No pulmonary mass. No pleural effusion. No pneumothorax. Upper Abdomen: No acute abnormality. Musculoskeletal: No chest wall abnormality. No suspicious lytic or blastic osseous lesions. No acute displaced fracture. Review of the MIP images confirms the above findings. IMPRESSION: 1. No pulmonary embolus. 2. Enlarged main pulmonary artery-correlate for pulmonary hypertension. 3. Bilateral lower lobe bronchial wall thickening suggestive of small airway disease. Electronically Signed   By: Tish Frederickson M.D.   On: 12/27/2022 22:21   DG Chest Port 1 View  Result Date: 12/27/2022 CLINICAL DATA:  Hypotension and shortness of breath EXAM: PORTABLE CHEST 1 VIEW COMPARISON:  None Available. FINDINGS: The heart size and mediastinal contours are within normal limits. Both lungs are clear. The visualized skeletal structures are unremarkable. IMPRESSION: No active disease. Electronically Signed   By: Alcide Clever M.D.   On: 12/27/2022 20:26     Assessment and Plan:   Elevated troponin  HS troponin 19 --> 42 --> 88 - EKG does not appear ischemic - she continues to have chest pain that is reproducible with deep palpation in intercostal space in her right anterior chest - her main symptom at the time of her episode was shortness of breath - lower suspicion for ischemic heart disease with few CRF (HLD), no family history of premature heart disease - CTA was negative for aortic atherosclerosis and coronary artery  calcifications - suspect chest pain is MSK in origin and troponin elevation likely related to sudden and severe SOB - could consider OP coronary CTA if she continues to have chest pain  Question of Pulmonary hypertension - enlarged main pulmonary artery suspicious for pulmonary hypertension - echo pending   Lactic acidosis Lactic acid on admission 4.1 --> 1.4 Procalcitonin WNL WBC 19.2 - respiratory panel and COVID negative - unclear etiology for leukocytosis with the exception for allergic reaction and IV steroids   Elevated D-dimer 2.49 CTA negative for PE   Hyperlipidemia with LDL goal < 70 12/28/2022: Cholesterol 218; HDL 54; LDL Cholesterol 150; Triglycerides 68; VLDL 14 She is starting to return to exercise - consider a 6 month trial of diet and exercise, but would recommend LDL under 100    Wheezing Possible exercise-induced asthma Possible small airway disease PFT and pulmonary consulted   Allergic reaction Possibly to zofran   Risk Assessment/Risk Scores:     TIMI Risk Score for Unstable Angina or Non-ST Elevation MI:   The patient's TIMI risk score is 2, which indicates a 8% risk of all cause mortality, new or recurrent myocardial infarction or need for urgent revascularization in the next 14 days.          For questions or updates, please contact [facility] Please consult [email or web address] for contact info under    Signed, Marcelino Duster, PA  12/28/2022 10:19 AM'  I have seen and examined the patient along with Marcelino Duster, PA.  I have reviewed the chart, notes and new data.  I agree with PA/NP's note.  Key new complaints: currently asymptomatic. Had exertional dyspnea that did not resolve with rest, associated with nausea.  Subsequent events were associated with what truly sounds like an allergic reaction to ondansetron.  She does mention occasional episodes of atypical chest tightness that occur at rest and resolved  spontaneously. Key examination changes: Normal cardiovascular exam. Key new findings / data: Normal echocardiogram.  Normal regional LV wall motion and EF, normal size and function of the right ventricle.  The pulmonary artery is described as dilated on the CT of the chest, but on the echocardiogram there are no corresponding findings to suggest pulmonary hypertension, either acute or chronic.  There was no evidence of pulmonary embolism.  ECG is normal.  Minimal elevation in high-sensitivity troponin is of uncertain origin and likely nonspecific.  It is possible that hypotension during her allergic response could have caused a slight increase in troponin.  Suspicion for coronary event is low.  Similarly, there was a nonspecific increase in WBC, possibly related to acute stress demargination or treatment with steroids for the allergic reaction.  Chest x-ray is completely normal and respiratory panel including COVID has been negative.  PLAN: At this time there is a plan for pulmonary function tests and evaluation by pulmonology.  The patient's mother has exercise-induced asthma and wonders whether her daughter has similar issues. No clear explanation for the acute event that made her short of breath.  Consider outpatient coronary CT angiogram if symptoms recur.  Thurmon Fair, MD, [facility] [phone] 12/28/2022, 1:16 PM

## 2022-12-28 NOTE — ED Notes (Signed)
ED TO INPATIENT HANDOFF REPORT  ED Nurse Name and Phone #: Deon Pilling [phone]  S Name/Age/Gender Brenda Barker 42 y.o. female Room/Bed: WA16/WA16  Code Status   Code Status: Full Code  Home/SNF/Other Home Patient oriented to: self, place, time, and situation Is this baseline? Yes   Triage Complete: Triage complete  Chief Complaint Elevated troponin [R79.89]  Triage Note Pt BIB EMS from [facility] for [facility] and hypotension.   Bp: 90/50 to 104/64 after fluids  20g Left Arm fluid given   Allergies Allergies  Allergen Reactions   Zofran [Ondansetron] Hives, Shortness Of Breath, Rash and Other (See Comments)    Skin burning, also (had to receive an EpiPen to reverse the allergic reaction)   Soy Allergy Nausea And Vomiting    Level of Care/Admitting Diagnosis ED Disposition     ED Disposition  Admit   Condition  --   Comment  Hospital Area: [facility] [[id number]]  Level of Care: Telemetry Cardiac [[id number]]  May place patient in observation at [facility] or [facility] if equivalent level of care is available:: No  Covid Evaluation: Asymptomatic - no recent exposure (last 10 days) testing not required  Diagnosis: Elevated troponin [321909]  Admitting Physician: Darlin Drop [[id number]]  Attending Physician: Darlin Drop [[id number]]          B Medical/Surgery History No past medical history on file. A&Ox4   A IV Location/Drains/Wounds Patient Lines/Drains/Airways Status     Active Line/Drains/Airways     Name Placement date Placement time Site Days   Peripheral IV 12/27/22 20 G Anterior;Left Forearm 12/27/22  2006  Forearm  1   Peripheral IV 12/28/22 20 G Right Antecubital 12/28/22  0055  Antecubital  less than 1            Intake/Output Last 24 hours No intake or output data in the 24 hours ending 12/28/22 0138  Labs/Imaging Results for orders placed or performed during the hospital encounter of 12/27/22 (from the past 48  hour(s))  CBC with Differential     Status: Abnormal   Collection Time: 12/27/22  8:35 PM  Result Value Ref Range   WBC 26.8 (H) 4.0 - 10.5 K/uL   RBC 4.35 3.87 - 5.11 MIL/uL   Hemoglobin 14.5 12.0 - 15.0 g/dL   HCT 10.2 72.5 - 36.6 %   MCV 97.9 80.0 - 100.0 fL   MCH 33.3 26.0 - 34.0 pg   MCHC 34.0 30.0 - 36.0 g/dL   RDW 44.0 34.7 - 42.5 %   Platelets 333 150 - 400 K/uL   nRBC 0.0 0.0 - 0.2 %   Neutrophils Relative % 76 %   Neutro Abs 20.8 (H) 1.7 - 7.7 K/uL   Lymphocytes Relative 15 %   Lymphs Abs 3.9 0.7 - 4.0 K/uL   Monocytes Relative 6 %   Monocytes Absolute 1.5 (H) 0.1 - 1.0 K/uL   Eosinophils Relative 1 %   Eosinophils Absolute 0.2 0.0 - 0.5 K/uL   Basophils Relative 0 %   Basophils Absolute 0.1 0.0 - 0.1 K/uL   Immature Granulocytes 2 %   Abs Immature Granulocytes 0.40 (H) 0.00 - 0.07 K/uL    Comment: Performed at [facility], [address], [place], [place] [address]  Comprehensive metabolic panel     Status: Abnormal   Collection Time: 12/27/22  8:35 PM  Result Value Ref Range   Sodium 136 135 - 145 mmol/L   Potassium 3.6  3.5 - 5.1 mmol/L   Chloride 100 98 - 111 mmol/L   CO2 24 22 - 32 mmol/L   Glucose, Bld 196 (H) 70 - 99 mg/dL    Comment: Glucose reference range applies only to samples taken after fasting for at least 8 hours.   BUN 15 6 - 20 mg/dL   Creatinine, Ser 1.61 0.44 - 1.00 mg/dL   Calcium 9.0 8.9 - 09.6 mg/dL   Total Protein 7.8 6.5 - 8.1 g/dL   Albumin 4.3 3.5 - 5.0 g/dL   AST 23 15 - 41 U/L   ALT 19 0 - 44 U/L   Alkaline Phosphatase 50 38 - 126 U/L   Total Bilirubin 0.8 0.3 - 1.2 mg/dL   GFR, Estimated >04 >54 mL/min    Comment: (NOTE) Calculated using the CKD-EPI Creatinine Equation (2021)    Anion gap 12 5 - 15    Comment: Performed at [facility], [address], [place], [place] [address]  Troponin I (High Sensitivity)     Status: Abnormal   Collection Time: 12/27/22  8:35 PM  Result Value Ref  Range   Troponin I (High Sensitivity) 19 (H) <18 ng/L    Comment: (NOTE) Elevated high sensitivity troponin I (hsTnI) values and significant  changes across serial measurements may suggest ACS but many other  chronic and acute conditions are known to elevate hsTnI results.  Refer to the "Links" section for chest pain algorithms and additional  guidance. Performed at [facility], [address], [place], [place] [address]   CK     Status: None   Collection Time: 12/27/22  8:35 PM  Result Value Ref Range   Total CK 84 38 - 234 U/L    Comment: Performed at [facility], [address], [place], [place] [address]  Brain natriuretic peptide     Status: None   Collection Time: 12/27/22  8:35 PM  Result Value Ref Range   B Natriuretic Peptide 18.0 0.0 - 100.0 pg/mL    Comment: Performed at [facility], [address], [place], [place] [address]  I-Stat beta hCG blood, ED ([facility], [facility], [facility] only)     Status: None   Collection Time: 12/27/22  8:37 PM  Result Value Ref Range   I-stat hCG, quantitative <5.0 <5 mIU/mL   Comment 3            Comment:   GEST. AGE      CONC.  (mIU/mL)   <=1 WEEK        5 - 50     2 WEEKS       50 - 500     3 WEEKS       100 - 10,000     4 WEEKS     1,000 - 30,000        FEMALE AND NON-PREGNANT FEMALE:     LESS THAN 5 mIU/mL   D-dimer, quantitative     Status: Abnormal   Collection Time: 12/27/22  9:20 PM  Result Value Ref Range   D-Dimer, Quant 2.49 (H) 0.00 - 0.50 ug/mL-FEU    Comment: (NOTE) At the manufacturer cut-off value of 0.5 g/mL FEU, this assay has a negative predictive value of 95-100%.This assay is intended for use in conjunction with a clinical pretest probability (PTP) assessment model to exclude pulmonary embolism (PE) and deep venous thrombosis (DVT) in outpatients suspected of PE or DVT. Results should be correlated with  clinical presentation. Performed at [facility], [address] [place], [place] [address]   Troponin I (High Sensitivity)     Status: Abnormal   Collection Time: 12/27/22  9:45 PM  Result Value Ref Range   Troponin I (High Sensitivity) 42 (H) <18 ng/L    Comment: READ BACK AND VERIFIED WITH Sadao Weyer,A RN @ 2229 12/27/22 BY CHILDRESS,E (NOTE) Elevated high sensitivity troponin I (hsTnI) values and significant  changes across serial measurements may suggest ACS but many other  chronic and acute conditions are known to elevate hsTnI results.  Refer to the "Links" section for chest pain algorithms and additional  guidance. Performed at [facility], [address], [place], [place] [address]   CBG monitoring, ED     Status: Abnormal   Collection Time: 12/28/22 12:40 AM  Result Value Ref Range   Glucose-Capillary 180 (H) 70 - 99 mg/dL    Comment: Glucose reference range applies only to samples taken after fasting for at least 8 hours.  CBC     Status: Abnormal   Collection Time: 12/28/22  1:15 AM  Result Value Ref Range   WBC 19.2 (H) 4.0 - 10.5 K/uL   RBC 3.78 (L) 3.87 - 5.11 MIL/uL   Hemoglobin 12.5 12.0 - 15.0 g/dL   HCT 29.5 28.4 - 13.2 %   MCV 97.4 80.0 - 100.0 fL   MCH 33.1 26.0 - 34.0 pg   MCHC 34.0 30.0 - 36.0 g/dL   RDW 44.0 10.2 - 72.5 %   Platelets 279 150 - 400 K/uL   nRBC 0.0 0.0 - 0.2 %    Comment: Performed at [facility], [address], [place], [place] [address]   CT Angio Chest PE W and/or Wo Contrast  Result Date: 12/27/2022 CLINICAL DATA:  Pulmonary embolism (PE) suspected, high prob EXAM: CT ANGIOGRAPHY CHEST WITH CONTRAST TECHNIQUE: Multidetector CT imaging of the chest was performed using the standard protocol during bolus administration of intravenous contrast. Multiplanar CT image reconstructions and MIPs were obtained to evaluate the vascular anatomy. RADIATION DOSE REDUCTION: This exam was performed according to the departmental dose-optimization program  which includes automated exposure control, adjustment of the mA and/or kV according to patient size and/or use of iterative reconstruction technique. CONTRAST:  80mL OMNIPAQUE IOHEXOL 350 MG/ML SOLN COMPARISON:  Chest x-ray 12/27/2022 FINDINGS: Cardiovascular: Satisfactory opacification of the pulmonary arteries to the segmental level. No evidence of pulmonary embolism. The main pulmonary artery is enlarged measuring up to 3.3 cm. Prominent heart size. No significant pericardial effusion. The thoracic aorta is normal in caliber. No atherosclerotic plaque of the thoracic aorta. No coronary artery calcifications. Mediastinum/Nodes: No enlarged mediastinal, hilar, or axillary lymph nodes. Thyroid gland, trachea, and esophagus demonstrate no significant findings. Lungs/Pleura: Bilateral lower lobe bronchial wall thickening, right greater than left. Bibasilar atelectasis. No focal consolidation. No pulmonary nodule. No pulmonary mass. No pleural effusion. No pneumothorax. Upper Abdomen: No acute abnormality. Musculoskeletal: No chest wall abnormality. No suspicious lytic or blastic osseous lesions. No acute displaced fracture. Review of the MIP images confirms the above findings. IMPRESSION: 1. No pulmonary embolus. 2. Enlarged main pulmonary artery-correlate for pulmonary hypertension. 3. Bilateral lower lobe bronchial wall thickening suggestive of small airway disease. Electronically Signed   By: Tish Frederickson M.D.   On: 12/27/2022 22:21   DG Chest Port 1 View  Result Date: 12/27/2022 CLINICAL DATA:  Hypotension and shortness of breath EXAM: PORTABLE CHEST 1 VIEW COMPARISON:  None  Available. FINDINGS: The heart size and mediastinal contours are within normal limits. Both lungs are clear. The visualized skeletal structures are unremarkable. IMPRESSION: No active disease. Electronically Signed   By: Alcide Clever M.D.   On: 12/27/2022 20:26    Pending Labs Unresulted Labs (From admission, onward)     Start      Ordered   01/03/23 0500  Creatinine, serum  (enoxaparin (LOVENOX)    CrCl >/= 30 ml/min)  Weekly,   R     Comments: while on enoxaparin therapy    12/27/22 2347   12/29/22 0500  HIV Antibody (routine testing w rflx)  (HIV Antibody (Routine testing w reflex) panel)  Tomorrow morning,   R        12/28/22 0022   12/28/22 0500  Basic metabolic panel  Tomorrow morning,   R        12/27/22 2348   12/28/22 0500  Magnesium  Tomorrow morning,   R        12/27/22 2348   12/28/22 0500  Phosphorus  Tomorrow morning,   R        12/27/22 2348   12/28/22 0500  Lipid panel  Tomorrow morning,   R        12/28/22 0022   12/28/22 0500  Hemoglobin A1c  Tomorrow morning,   R        12/28/22 0022   12/28/22 0500  Procalcitonin  Tomorrow morning,   R       References:    Procalcitonin Lower Respiratory Tract Infection AND Sepsis Procalcitonin Algorithm   12/28/22 0119   12/28/22 0029  CBC  ONCE - STAT,   STAT        12/28/22 0028   12/28/22 0027  Lactic acid, plasma  STAT Now then every 3 hours,   R      12/28/22 0026   12/28/22 0007  SARS Coronavirus 2 by RT PCR (hospital order, performed in [facility] hospital lab) *cepheid single result test* Anterior Nasal Swab  (Tier 2 - SARS Coronavirus 2 by RT PCR (hospital order, performed in [facility] hospital lab) *cepheid single result test*)  Once,   R       Question:  Patient immune status  Answer:  Normal   12/28/22 0006   12/28/22 0007  Respiratory (~20 pathogens) panel by PCR  (Respiratory panel by PCR (~20 pathogens, ~24 hr TAT)  w precautions)  ONCE - URGENT,   URGENT       Question:  Patient immune status  Answer:  Normal   12/28/22 0006            Vitals/Pain Today's Vitals   12/27/22 2115 12/27/22 2130 12/27/22 2145 12/27/22 2351  BP: 125/68 127/73 124/82 134/70  Pulse: 72 70 69 65  Resp: 18 20  16   Temp:    98.3 F (36.8 C)  TempSrc:    Oral  SpO2: 100% 100% 100% 100%  PainSc:        Isolation Precautions Droplet  precaution  Medications Medications  enoxaparin (LOVENOX) injection 40 mg (has no administration in time range)  ipratropium-albuterol (DUONEB) 0.5-2.5 (3) MG/3ML nebulizer solution 3 mL (has no administration in time range)  nitroGLYCERIN (NITROSTAT) SL tablet 0.4 mg (has no administration in time range)  acetaminophen (TYLENOL) tablet 650 mg (has no administration in time range)  prochlorperazine (COMPAZINE) injection 5 mg (has no administration in time range)  melatonin tablet 5 mg (has no administration in time range)  polyethylene glycol (MIRALAX / GLYCOLAX) packet 17 g (has no administration in time range)  insulin aspart (novoLOG) injection 0-9 Units (has no administration in time range)  ipratropium-albuterol (DUONEB) 0.5-2.5 (3) MG/3ML nebulizer solution 3 mL (has no administration in time range)  aspirin EC tablet 325 mg (has no administration in time range)  potassium chloride SA (KLOR-CON M) CR tablet 40 mEq (has no administration in time range)  EPINEPHrine (EPI-PEN) injection 0.3 mg (0.3 mg Intramuscular Given 12/27/22 2007)  dexamethasone (DECADRON) injection 10 mg (10 mg Intravenous Given 12/27/22 2008)  diphenhydrAMINE (BENADRYL) injection 50 mg (50 mg Intravenous Given 12/27/22 2007)  famotidine (PEPCID) IVPB 20 mg premix (0 mg Intravenous Stopped 12/27/22 2053)  ketorolac (TORADOL) 30 MG/ML injection 30 mg (30 mg Intravenous Given 12/27/22 2213)  iohexol (OMNIPAQUE) 350 MG/ML injection 80 mL (80 mLs Intravenous Contrast Given 12/27/22 2156)    Mobility walks     Focused Assessments    R Recommendations: See Admitting Provider Note  Report given to:   Additional Notes:

## 2022-12-28 NOTE — Progress Notes (Signed)
   12/28/22 1158  Spiritual Encounters  Type of Visit Initial  Care provided to: Patient  Conversation partners present during encounter Nurse  Reason for visit Advance directives  OnCall Visit No   Chaplain responded to Spiritual Consult for AD education.  Chaplain and intern Crystal went to room and introduced ourselves, explanting to the PT that we received a consult request to speak to her about an Advance Directive.  We asked if she was willing speak with [place] and PT agreed.  Chaplain provided education on HCPOA and Living Will, explaining their need and importance.  PT was receptive to information and wanted to take paperwork home to discuss with her family.  Chaplain shared information on how to return the document to hospital if needed. PT also shared her concern for her 37 year old daughter and expressed her worry that she might be taken by the daughter's father's side of the family if anything ever happened to the PT.  PT inquired about other legal services offered by the hospital- chaplain promised to investigate the question and return with an answer.

## 2022-12-29 ENCOUNTER — Observation Stay (HOSPITAL_BASED_OUTPATIENT_CLINIC_OR_DEPARTMENT_OTHER): Payer: BC Managed Care – PPO

## 2022-12-29 DIAGNOSIS — I2510 Atherosclerotic heart disease of native coronary artery without angina pectoris: Secondary | ICD-10-CM

## 2022-12-29 DIAGNOSIS — R072 Precordial pain: Secondary | ICD-10-CM

## 2022-12-29 DIAGNOSIS — R7989 Other specified abnormal findings of blood chemistry: Secondary | ICD-10-CM | POA: Diagnosis not present

## 2022-12-29 LAB — URINALYSIS, W/ REFLEX TO CULTURE (INFECTION SUSPECTED)
Bacteria, UA: NONE SEEN
Bilirubin Urine: NEGATIVE
Glucose, UA: NEGATIVE mg/dL
Hgb urine dipstick: NEGATIVE
Ketones, ur: NEGATIVE mg/dL
Leukocytes,Ua: NEGATIVE
Nitrite: NEGATIVE
Protein, ur: NEGATIVE mg/dL
Specific Gravity, Urine: 1.032 — ABNORMAL HIGH (ref 1.005–1.030)
pH: 5 (ref 5.0–8.0)

## 2022-12-29 LAB — HEMOGLOBIN A1C
Hgb A1c MFr Bld: 5.3 % (ref 4.8–5.6)
Mean Plasma Glucose: 105 mg/dL

## 2022-12-29 LAB — GLUCOSE, CAPILLARY
Glucose-Capillary: 112 mg/dL — ABNORMAL HIGH (ref 70–99)
Glucose-Capillary: 113 mg/dL — ABNORMAL HIGH (ref 70–99)
Glucose-Capillary: 125 mg/dL — ABNORMAL HIGH (ref 70–99)
Glucose-Capillary: 93 mg/dL (ref 70–99)

## 2022-12-29 MED ORDER — ROSUVASTATIN CALCIUM 10 MG PO TABS: 10.0000 mg | ORAL_TABLET | Freq: Every day | ORAL | 0 refills | Status: AC

## 2022-12-29 MED ORDER — ALBUTEROL SULFATE HFA 108 (90 BASE) MCG/ACT IN AERS: 2.0000 | INHALATION_SPRAY | Freq: Four times a day (QID) | RESPIRATORY_TRACT | 0 refills | Status: AC | PRN

## 2022-12-29 MED ORDER — IOHEXOL 350 MG/ML SOLN
75.0000 mL | Freq: Once | INTRAVENOUS | Status: AC | PRN
  Administered 2022-12-29: 75 mL via INTRAVENOUS

## 2022-12-29 MED ORDER — NITROGLYCERIN 0.4 MG SL SUBL
SUBLINGUAL_TABLET | SUBLINGUAL | Status: AC
  Filled 2022-12-29: qty 2

## 2022-12-29 MED ORDER — ROSUVASTATIN CALCIUM 5 MG PO TABS: 10.0000 mg | ORAL_TABLET | Freq: Every day | ORAL | Status: DC

## 2022-12-29 NOTE — Progress Notes (Signed)
Per CT staff , procedure will done around 7-8 am. HR should be less than 70 bpm during the procedure per CT staff. Patient's HR maintaing between 50's-60's bpm. PRN Metoprolol not given.  Patient on NPO PMN.

## 2022-12-29 NOTE — Progress Notes (Addendum)
Rounding Note    Patient Name: Capitola Mccartin Date of Encounter: 12/29/2022   [facility] Cardiologist: Thurmon Fair, MD   Subjective   She is feeling well this morning. She is seen during CT study. She denied chest pain. She denied any allergic reaction to nitro in the past. BP 128/76 before nitro given. BP was 116/82 post nitro administration. She reports no adverse effects.   Inpatient Medications    Scheduled Meds:  enoxaparin (LOVENOX) injection  40 mg Subcutaneous Q24H   insulin aspart  0-9 Units Subcutaneous Q4H   loratadine  10 mg Oral Daily   Continuous Infusions:  PRN Meds: acetaminophen, ipratropium-albuterol, melatonin, metoprolol tartrate, nitroGLYCERIN, polyethylene glycol, prochlorperazine   Vital Signs    Vitals:   12/28/22 1617 12/28/22 1930 12/29/22 0351 12/29/22 0511  BP: (!) 141/88 (!) 148/84 125/67 116/68  Pulse: 66 66 62   Resp: 18 18 18    Temp: 98 F (36.7 C) 97.9 F (36.6 C) 98 F (36.7 C)   TempSrc: Oral Oral Oral   SpO2: 100% 100% 97%   Weight:      Height:        Intake/Output Summary (Last 24 hours) at 12/29/2022 1610 Last data filed at 12/28/2022 2000 Gross per 24 hour  Intake 120 ml  Output 400 ml  Net -280 ml      12/28/2022    5:02 AM  Last 3 Weights  Weight (lbs) 169 lb 8.5 oz  Weight (kg) 76.9 kg      Telemetry    Sinus rhythm/bradycardia mid 50 to 60s  - Personally Reviewed  ECG    N/A  today- Personally Reviewed  Physical Exam   GEN: No acute distress.   Neck: No JVD Cardiac: RRR, no murmurs, rubs, or gallops.  Respiratory: Clear to auscultation bilaterally.  On room air.  GI: Soft, nontender, non-distended  MS: No leg edema; No deformity. Neuro:  Nonfocal  Psych: Normal affect   Labs    High Sensitivity Troponin:   Recent Labs  Lab 12/27/22 2035 12/27/22 2145 12/28/22 0115  TROPONINIHS 19* 42* 88*     Chemistry Recent Labs  Lab 12/27/22 2035 12/28/22 0115 12/28/22 0414  NA 136  133*  --   K 3.6 4.2  --   CL 100 102  --   CO2 24 20*  --   GLUCOSE 196* 170*  --   BUN 15 12  --   CREATININE 0.92 0.95 0.84  CALCIUM 9.0 8.8*  --   MG  --  1.8  --   PROT 7.8  --   --   ALBUMIN 4.3  --   --   AST 23  --   --   ALT 19  --   --   ALKPHOS 50  --   --   BILITOT 0.8  --   --   GFRNONAA >60 >60 >60  ANIONGAP 12 11  --     Lipids  Recent Labs  Lab 12/28/22 0115  CHOL 218*  TRIG 68  HDL 54  LDLCALC 150*  CHOLHDL 4.0    Hematology Recent Labs  Lab 12/27/22 2035 12/28/22 0115  WBC 26.8* 19.2*  RBC 4.35 3.78*  HGB 14.5 12.5  HCT 42.6 36.8  MCV 97.9 97.4  MCH 33.3 33.1  MCHC 34.0 34.0  RDW 11.6 11.8  PLT 333 279   Thyroid No results for input(s): "TSH", "FREET4" in the last 168 hours.  BNP Recent Labs  Lab 12/27/22 2035  BNP 18.0    DDimer  Recent Labs  Lab 12/27/22 2120  DDIMER 2.49*     Radiology    ECHOCARDIOGRAM COMPLETE  Result Date: 12/28/2022    ECHOCARDIOGRAM REPORT   Patient Name:   [facility] [place] Date of Exam: 12/28/2022 Medical Rec #:  [id number]      Height:       66.0 in Accession #:    [id number]     Weight:       169.5 lb Date of Birth:  1980/08/07       BSA:          1.864 m Patient Age:    42 years       BP:           139/73 mmHg Patient Gender: F              HR:           59 bpm. Exam Location:  Inpatient Procedure: 2D Echo, Color Doppler, Cardiac Doppler, Strain Analysis and            Intracardiac Opacification Agent Indications:    Elevated Troponins  History:        Patient has no prior history of Echocardiogram examinations.                 Risk Factors:Hypertension.  Sonographer:    Irving Burton Senior RDCS Referring Phys: [id number] Oliver Pila HALL  Sonographer Comments: Definity used to assess for apical variant HCM IMPRESSIONS  1. Left ventricular ejection fraction, by estimation, is 70 to 75%. The left ventricle has normal function. The left ventricle has no regional wall motion abnormalities. There is mild concentric left ventricular  hypertrophy. Left ventricular diastolic parameters are consistent with Grade I diastolic dysfunction (impaired relaxation). The average left ventricular global longitudinal strain is -19.9 %. The global longitudinal strain is normal.  2. Right ventricular systolic function is normal. The right ventricular size is normal. Tricuspid regurgitation signal is inadequate for assessing PA pressure.  3. Left atrial size was mild to moderately dilated.  4. Right atrial size was mildly dilated.  5. The mitral valve is normal in structure. Trivial mitral valve regurgitation. No evidence of mitral stenosis.  6. The aortic valve is tricuspid. Aortic valve regurgitation is not visualized. No aortic stenosis is present.  7. The inferior vena cava is normal in size with <50% respiratory variability, suggesting right atrial pressure of 8 mmHg. FINDINGS  Left Ventricle: Left ventricular ejection fraction, by estimation, is 70 to 75%. The left ventricle has normal function. The left ventricle has no regional wall motion abnormalities. Definity contrast agent was given IV to delineate the left ventricular  endocardial borders. The average left ventricular global longitudinal strain is -19.9 %. The global longitudinal strain is normal. The left ventricular internal cavity size was normal in size. There is mild concentric left ventricular hypertrophy. Left ventricular diastolic parameters are consistent with Grade I diastolic dysfunction (impaired relaxation). Normal left ventricular filling pressure. Right Ventricle: The right ventricular size is normal. No increase in right ventricular wall thickness. Right ventricular systolic function is normal. Tricuspid regurgitation signal is inadequate for assessing PA pressure. The tricuspid regurgitant velocity is 2.00 m/s, and with an assumed right atrial pressure of 8 mmHg, the estimated right ventricular systolic pressure is 24.0 mmHg. Left Atrium: Left atrial size was mild to moderately  dilated. Right Atrium: Right atrial size was mildly dilated. Pericardium: There is no evidence of pericardial  effusion. Mitral Valve: The mitral valve is normal in structure. Trivial mitral valve regurgitation. No evidence of mitral valve stenosis. Tricuspid Valve: The tricuspid valve is normal in structure. Tricuspid valve regurgitation is trivial. No evidence of tricuspid stenosis. Aortic Valve: The aortic valve is tricuspid. Aortic valve regurgitation is not visualized. No aortic stenosis is present. Pulmonic Valve: The pulmonic valve was normal in structure. Pulmonic valve regurgitation is trivial. No evidence of pulmonic stenosis. Aorta: The aortic root and ascending aorta are structurally normal, with no evidence of dilitation. Venous: The inferior vena cava is normal in size with less than 50% respiratory variability, suggesting right atrial pressure of 8 mmHg. IAS/Shunts: No atrial level shunt detected by color flow Doppler.  LEFT VENTRICLE PLAX 2D LVIDd:         4.50 cm   Diastology LVIDs:         2.70 cm   LV e' medial:    9.46 cm/s LV PW:         0.90 cm   LV E/e' medial:  7.5 LV IVS:        0.90 cm   LV e' lateral:   10.70 cm/s LVOT diam:     1.90 cm   LV E/e' lateral: 6.7 LV SV:         61 LV SV Index:   33        2D Longitudinal Strain LVOT Area:     2.84 cm  2D Strain GLS (A2C):   -21.4 %                          2D Strain GLS (A3C):   -16.3 %                          2D Strain GLS (A4C):   -21.9 %                          2D Strain GLS Avg:     -19.9 % RIGHT VENTRICLE RV S prime:     12.40 cm/s TAPSE (M-mode): 2.4 cm LEFT ATRIUM             Index        RIGHT ATRIUM           Index LA diam:        2.70 cm 1.45 cm/m   RA Area:     19.80 cm LA Vol (A2C):   60.9 ml 32.67 ml/m  RA Volume:   55.00 ml  29.50 ml/m LA Vol (A4C):   62.9 ml 33.74 ml/m LA Biplane Vol: 62.3 ml 33.42 ml/m  AORTIC VALVE LVOT Vmax:   110.00 cm/s LVOT Vmean:  69.300 cm/s LVOT VTI:    0.214 m  AORTA Ao Root diam: 2.70 cm Ao  Asc diam:  3.20 cm MITRAL VALVE               TRICUSPID VALVE MV Area (PHT): 2.72 cm    TR Peak grad:   16.0 mmHg MV Decel Time: 279 msec    TR Vmax:        200.00 cm/s MV E velocity: 71.30 cm/s MV A velocity: 81.80 cm/s  SHUNTS MV E/A ratio:  0.87        Systemic VTI:  0.21 m  Systemic Diam: 1.90 cm Chilton Si MD Electronically signed by Chilton Si MD Signature Date/Time: 12/28/2022/12:26:08 PM    Final    CT Angio Chest PE W and/or Wo Contrast  Result Date: 12/27/2022 CLINICAL DATA:  Pulmonary embolism (PE) suspected, high prob EXAM: CT ANGIOGRAPHY CHEST WITH CONTRAST TECHNIQUE: Multidetector CT imaging of the chest was performed using the standard protocol during bolus administration of intravenous contrast. Multiplanar CT image reconstructions and MIPs were obtained to evaluate the vascular anatomy. RADIATION DOSE REDUCTION: This exam was performed according to the departmental dose-optimization program which includes automated exposure control, adjustment of the mA and/or kV according to patient size and/or use of iterative reconstruction technique. CONTRAST:  80mL OMNIPAQUE IOHEXOL 350 MG/ML SOLN COMPARISON:  Chest x-ray 12/27/2022 FINDINGS: Cardiovascular: Satisfactory opacification of the pulmonary arteries to the segmental level. No evidence of pulmonary embolism. The main pulmonary artery is enlarged measuring up to 3.3 cm. Prominent heart size. No significant pericardial effusion. The thoracic aorta is normal in caliber. No atherosclerotic plaque of the thoracic aorta. No coronary artery calcifications. Mediastinum/Nodes: No enlarged mediastinal, hilar, or axillary lymph nodes. Thyroid gland, trachea, and esophagus demonstrate no significant findings. Lungs/Pleura: Bilateral lower lobe bronchial wall thickening, right greater than left. Bibasilar atelectasis. No focal consolidation. No pulmonary nodule. No pulmonary mass. No pleural effusion. No pneumothorax.  Upper Abdomen: No acute abnormality. Musculoskeletal: No chest wall abnormality. No suspicious lytic or blastic osseous lesions. No acute displaced fracture. Review of the MIP images confirms the above findings. IMPRESSION: 1. No pulmonary embolus. 2. Enlarged main pulmonary artery-correlate for pulmonary hypertension. 3. Bilateral lower lobe bronchial wall thickening suggestive of small airway disease. Electronically Signed   By: Tish Frederickson M.D.   On: 12/27/2022 22:21   DG Chest Port 1 View  Result Date: 12/27/2022 CLINICAL DATA:  Hypotension and shortness of breath EXAM: PORTABLE CHEST 1 VIEW COMPARISON:  None Available. FINDINGS: The heart size and mediastinal contours are within normal limits. Both lungs are clear. The visualized skeletal structures are unremarkable. IMPRESSION: No active disease. Electronically Signed   By: Alcide Clever M.D.   On: 12/27/2022 20:26    Cardiac Studies   Echo from 12/28/22:  1. Left ventricular ejection fraction, by estimation, is 70 to 75%. The  left ventricle has normal function. The left ventricle has no regional  wall motion abnormalities. There is mild concentric left ventricular  hypertrophy. Left ventricular diastolic  parameters are consistent with Grade I diastolic dysfunction (impaired  relaxation). The average left ventricular global longitudinal strain is  -19.9 %. The global longitudinal strain is normal.   2. Right ventricular systolic function is normal. The right ventricular  size is normal. Tricuspid regurgitation signal is inadequate for assessing  PA pressure.   3. Left atrial size was mild to moderately dilated.   4. Right atrial size was mildly dilated.   5. The mitral valve is normal in structure. Trivial mitral valve  regurgitation. No evidence of mitral stenosis.   6. The aortic valve is tricuspid. Aortic valve regurgitation is not  visualized. No aortic stenosis is present.   7. The inferior vena cava is normal in size with  <50% respiratory  variability, suggesting right atrial pressure of 8 mmHg.    Patient Profile     42 y.o. female with hx of pre-eclampsia, seizures (last seizure when 42 yo), HLD, allergic rhinitis, who presented with SOB with exertion and chest pain on exertion. HS troponin 19 --> 42 --> 88. EKG no acute.  CT coronary study planned today per cardiology.   Assessment & Plan    Chest pain with elevated troponin  - presented with chest pain after running treadmill, pain is reproducible with palpation  -  HS troponin 19 --> 42 --> 88 - EKG no acute finding - Echo from 5/31 showed LVEF 70-75%, no RWMA, mild LVH, grade I DD, normal RV, mild to mod LAE, mild RAE. Trivial MR - CTA negative for PE , POA  - coronary CT chest shows nonobstructive CAD< LDL 150, will add rosuvastatin 10 mg daily  Suspected asthma  Lactic acidosis  HLD  - per primary team   [facility] will sign off.   Medication Recommendations:  None Other recommendations (labs, testing, etc):  Add rosuvastatin 10 mg daily Follow up as an outpatient:  Will schedule     For questions or updates, please contact [facility] Please consult [email or web address] for contact info under    Signed, Cyndi Bender, NP  12/29/2022, 8:24 AM

## 2022-12-29 NOTE — Discharge Summary (Signed)
Physician Discharge Summary  Brenda Barker ZOX:[id number] DOB: February 27, 1981 DOA: 12/27/2022  PCP: Pcp, No  Admit date: 12/27/2022 Discharge date: 12/29/2022 Recommendations for Outpatient Follow-up:  Follow up with PCP in 1 weeks-call for appointment Please obtain BMP/CBC in one week  Discharge Dispo: home Discharge Condition: Stable Code Status:   Code Status: Full Code Diet recommendation:  Diet Order             Diet regular Fluid consistency: Thin  Diet effective now                    Brief/Interim Summary: 42 year old female with no significant past medical history presented with chest pain and severe dyspnea after running on the treadmill in the gym.Reportedly blood pressure was low at the gym, sent to the ED, and route blood pressure improved patient received Zofran, was having hives with concern for anaphylaxis and received epi inj  Pepcid Benadryl and Decadron. In the ED afebrile BP stable not hypoxic labs show significant leukocytosis elevation in troponin 19> 42, lactic acid is 4.1 Also suspected to have asthma, cardiology and pulmonary were consulted and transferred to [facility]. Seen by cardiology EKG nonischemic chest pain reproducible with deep palpation in intercostal area, low suspicion for ischemic heart disease CT was negative for aortic atherosclerosis and coronary artery calcification likely musculoskeletal, cardiology advised outpatient coronary CTA if she continues to have chest pain and they will arrange outpatient follow-up.Lvef 70-75%,No RWMA,g1dd.She is resting well no new complaints, on RA.  Due to concern by pulmonary team discussed with cardiology and underwent coronary CT chest that showed nonobstructive CAD LDL less than 150, no further recommendation and okay for discharge home  Discharge Diagnoses:  Principal Problem:   Elevated troponin Active Problems:   Coronary artery disease involving native coronary artery of native heart  Chest pain with  mildly elevated troponin: Respiratory distress Possible exercise-induced asthma Bilateral small airway disease with bronchial wall thickening Suspected pulmonary hypertension: Patient with respiratory distress chest pain dyspnea following treadmill.CTA neg for PE, showed Enlarged main pulmonary artery question pulmonary hypertension,bilateral lower lobe bronchial wall thickening cyst small with airway disease.  At this time distress resolved currently doing well on room air mild chest pain is reproducible on palpation will need outpatient follow-up with cardiology.?Pulmonary hypertension follow-up echo unremarkable.Per pulm less concerned about the asthma they will arrange op methacholine challenge test and OP PFT, fu in office.Discussed w/ Agarwal/Dr. C with cardiology given presentation with elevated troponin, less suspicion of pulmonary etiology, echocardiogram with normal EF, troponin was elevated but it only flat nonischemic pattern, CT coronaries unremarkable, okay for discharge no further recommendation patient follow-up with cardiology.    Allergic rhinitis follow-up with ENT Leukocytosis initially 26.8 improving 19.2, negative procalcitonin less than 0.1 no fever likely reactive also received Decadron. Elevated D-dimer 2.4 but CT angio chest negative for PE Lactic acidosis/metabolic acidosis likely from respiratory distress resolved Hyperlipidemia continue exercise and diet modification for 6 months before starting statins. Prolonged QTc monitor keep mag above 2 and potassium above 4 Allergic reaction to Zofran:Reportedly she had hives after Zofran patient was treated with EpiPen, Decadron Pepcid and Benadryl.  Allergy list updated. Hyperglycemia: pending HbA1c.Follow-up with PC Consults: Cardio Pulmonary  Subjective: Aaox3 no chest pain  Discharge Exam: Vitals:   12/29/22 0511 12/29/22 1416  BP: 116/68 128/70  Pulse:  60  Resp:  18  Temp:  97.6 F (36.4 C)  SpO2:  97%    General: Pt is alert, awake, not  in acute distress Cardiovascular: RRR, S1/S2 +, no rubs, no gallops Respiratory: CTA bilaterally, no wheezing, no rhonchi Abdominal: Soft, NT, ND, bowel sounds + Extremities: no edema, no cyanosis  Discharge Instructions  Discharge Instructions     Discharge instructions   Complete by: As directed    Please call call MD or return to ER for similar or worsening recurring problem that brought you to hospital or if any fever,nausea/vomiting,abdominal pain, uncontrolled pain, chest pain,  shortness of breath or any other alarming symptoms.  Please follow-up with pulmonary clinic for further lung function testing outpatient follow-up  Please follow-up your doctor as instructed in a week time and call the office for appointment.  Please avoid alcohol, smoking, or any other illicit substance and maintain healthy habits including taking your regular medications as prescribed.  You were cared for by a hospitalist during your hospital stay. If you have any questions about your discharge medications or the care you received while you were in the hospital after you are discharged, you can call the unit and ask to speak with the hospitalist on call if the hospitalist that took care of you is not available.  Once you are discharged, your primary care physician will handle any further medical issues. Please note that NO REFILLS for any discharge medications will be authorized once you are discharged, as it is imperative that you return to your primary care physician (or establish a relationship with a primary care physician if you do not have one) for your aftercare needs so that they can reassess your need for medications and monitor your lab values      Allergies as of 12/29/2022       Reactions   Zofran [ondansetron] Hives, Shortness Of Breath, Rash, Other (See Comments)   Skin burning, also (had to receive an EpiPen to reverse the allergic reaction)   Soy  Allergy Nausea And Vomiting        Medication List     TAKE these medications    albuterol 108 (90 Base) MCG/ACT inhaler Commonly known as: VENTOLIN HFA Inhale 2 puffs into the lungs every 6 (six) hours as needed for wheezing or shortness of breath.   Claritin 10 MG tablet Generic drug: loratadine Take 10 mg by mouth daily.   rosuvastatin 10 MG tablet Commonly known as: CRESTOR Take 1 tablet (10 mg total) by mouth daily.   TYLENOL 500 MG tablet Generic drug: acetaminophen Take 500-1,000 mg by mouth every 6 (six) hours as needed for mild pain or headache.        Follow-up Information     Croitoru, Mihai, MD Follow up in 1 week(s).   Specialty: Cardiology Contact information: [address] [place] [address] [phone]         Lynnell Catalan, MD Follow up in 1 week(s).   Specialty: Pulmonary Disease Contact information: [address] [place] [address] [phone]                Allergies  Allergen Reactions   Zofran [Ondansetron] Hives, Shortness Of Breath, Rash and Other (See Comments)    Skin burning, also (had to receive an EpiPen to reverse the allergic reaction)   Soy Allergy Nausea And Vomiting    The results of significant diagnostics from this hospitalization (including imaging, microbiology, ancillary and laboratory) are listed below for reference.    Microbiology: Recent Results (from the past 240 hour(s))  SARS Coronavirus 2 by RT PCR (hospital order,  performed in [facility] hospital lab) *cepheid single result test* Anterior Nasal Swab     Status: None   Collection Time: 12/28/22  1:16 AM   Specimen: Anterior Nasal Swab  Result Value Ref Range Status   SARS Coronavirus 2 by RT PCR NEGATIVE NEGATIVE Final    Comment: (NOTE) SARS-CoV-2 target nucleic acids are NOT DETECTED.  The SARS-CoV-2 RNA is generally detectable in upper and lower respiratory specimens during the acute phase of infection. The  lowest concentration of SARS-CoV-2 viral copies this assay can detect is 250 copies / mL. A negative result does not preclude SARS-CoV-2 infection and should not be used as the sole basis for treatment or other patient management decisions.  A negative result may occur with improper specimen collection / handling, submission of specimen other than nasopharyngeal swab, presence of viral mutation(s) within the areas targeted by this assay, and inadequate number of viral copies (<250 copies / mL). A negative result must be combined with clinical observations, patient history, and epidemiological information.  Fact Sheet for Patients:   RoadLapTop.co.za  Fact Sheet for Healthcare Providers: http://kim-miller.com/  This test is not yet approved or  cleared by the [place] FDA and has been authorized for detection and/or diagnosis of SARS-CoV-2 by FDA under an Emergency Use Authorization (EUA).  This EUA will remain in effect (meaning this test can be used) for the duration of the COVID-19 declaration under Section 564(b)(1) of the Act, 21 U.S.C. section 360bbb-3(b)(1), unless the authorization is terminated or revoked sooner.  Performed at [facility], [address] [place], [place] [address]   Respiratory (~20 pathogens) panel by PCR     Status: None   Collection Time: 12/28/22  1:17 AM   Specimen: Anterior Nasal Swab; Respiratory  Result Value Ref Range Status   Adenovirus NOT DETECTED NOT DETECTED Final   Coronavirus 229E NOT DETECTED NOT DETECTED Final    Comment: (NOTE) The Coronavirus on the Respiratory Panel, DOES NOT test for the novel  Coronavirus (2019 nCoV)    Coronavirus HKU1 NOT DETECTED NOT DETECTED Final   Coronavirus NL63 NOT DETECTED NOT DETECTED Final   Coronavirus OC43 NOT DETECTED NOT DETECTED Final   Metapneumovirus NOT DETECTED NOT DETECTED Final   Rhinovirus / Enterovirus NOT DETECTED  NOT DETECTED Final   Influenza A NOT DETECTED NOT DETECTED Final   Influenza B NOT DETECTED NOT DETECTED Final   Parainfluenza Virus 1 NOT DETECTED NOT DETECTED Final   Parainfluenza Virus 2 NOT DETECTED NOT DETECTED Final   Parainfluenza Virus 3 NOT DETECTED NOT DETECTED Final   Parainfluenza Virus 4 NOT DETECTED NOT DETECTED Final   Respiratory Syncytial Virus NOT DETECTED NOT DETECTED Final   Bordetella pertussis NOT DETECTED NOT DETECTED Final   Bordetella Parapertussis NOT DETECTED NOT DETECTED Final   Chlamydophila pneumoniae NOT DETECTED NOT DETECTED Final   Mycoplasma pneumoniae NOT DETECTED NOT DETECTED Final    Comment: Performed at [facility], [address] [place], [place] [address]  Culture, blood (Routine X 2) w Reflex to ID Panel     Status: None (Preliminary result)   Collection Time: 12/28/22  4:30 AM   Specimen: BLOOD  Result Value Ref Range Status   Specimen Description BLOOD BLOOD LEFT ARM  Final   Special Requests   Final    BOTTLES DRAWN AEROBIC AND ANAEROBIC Blood Culture results may not be optimal due to an excessive volume of blood received in culture bottles   Culture  Final    NO GROWTH 1 DAY Performed at [facility], [address] [place], [place] [address]    Report Status PENDING  Incomplete  Culture, blood (Routine X 2) w Reflex to ID Panel     Status: None (Preliminary result)   Collection Time: 12/28/22  4:30 AM   Specimen: BLOOD  Result Value Ref Range Status   Specimen Description BLOOD BLOOD RIGHT ARM  Final   Special Requests   Final    BOTTLES DRAWN AEROBIC AND ANAEROBIC Blood Culture results may not be optimal due to an excessive volume of blood received in culture bottles   Culture   Final    NO GROWTH 1 DAY Performed at [facility], [address], [place], [place] [address]    Report Status PENDING  Incomplete    Procedures/Studies: CT CORONARY MORPH W/CTA COR W/SCORE W/CA W/CM &/OR WO/CM  Result Date:  12/29/2022 CLINICAL DATA:  39F with chest pain EXAM: Cardiac/Coronary CTA TECHNIQUE: The patient was scanned on a [employer]. FINDINGS: A 100 kV prospective scan was triggered in the descending thoracic aorta at 111 HU's. Axial non-contrast 3 mm slices were carried out through the heart. The data set was analyzed on a dedicated work station and scored using the Agatson method. Gantry rotation speed was 250 msecs and collimation was .6 mm. No beta blockade and 0.8 mg of sl NTG was given. The 3D data set was reconstructed in 5% intervals of the 35-75% of the R-R cycle. Phases were analyzed on a dedicated work station using MPR, MIP and VRT modes. The patient received 100 cc of contrast. Coronary Arteries:  Normal coronary origin.  Right dominance. RCA is a large dominant artery that gives rise to PDA and PLA. Noncalcified plaque in proximal RCA causes 0-24% stenosis Left main is a large artery that gives rise to LAD and LCX arteries. LAD is a large vessel that has no plaque. LCX is a non-dominant artery that gives rise to one large OM1 branch. There is no plaque. Other findings: Left Ventricle: Normal size Left Atrium: Mild enlargement Pulmonary Veins: Normal configuration Right Ventricle: Mild enlargement Right Atrium: Normal size Cardiac valves: No calcifications Thoracic aorta: Normal size Pulmonary Arteries: Normal size Systemic Veins: Normal drainage Pericardium: Normal thickness IMPRESSION: 1. Coronary calcium score of 0. 2. Normal coronary origin with right dominance. 3. Nonobstructive CAD, with noncalcified plaque in proximal RCA causing minimal (0-24%) stenosis CAD-RADS 1. Minimal non-obstructive CAD (0-24%). Consider non-atherosclerotic causes of chest pain. Consider preventive therapy and risk factor modification. Electronically Signed   By: Epifanio Lesches M.D.   On: 12/29/2022 13:59   ECHOCARDIOGRAM COMPLETE  Result Date: 12/28/2022    ECHOCARDIOGRAM REPORT   Patient Name:   [facility]  [place] Date of Exam: 12/28/2022 Medical Rec #:  [id number]      Height:       66.0 in Accession #:    [id number]     Weight:       169.5 lb Date of Birth:  Jul 04, 1981       BSA:          1.864 m Patient Age:    42 years       BP:           139/73 mmHg Patient Gender: F              HR:           59 bpm. Exam Location:  Inpatient Procedure: 2D Echo, Color Doppler, Cardiac Doppler, Strain Analysis and            Intracardiac Opacification Agent Indications:    Elevated Troponins  History:        Patient has no prior history of Echocardiogram examinations.                 Risk Factors:Hypertension.  Sonographer:    Irving Burton Senior RDCS Referring Phys: [id number] Oliver Pila HALL  Sonographer Comments: Definity used to assess for apical variant HCM IMPRESSIONS  1. Left ventricular ejection fraction, by estimation, is 70 to 75%. The left ventricle has normal function. The left ventricle has no regional wall motion abnormalities. There is mild concentric left ventricular hypertrophy. Left ventricular diastolic parameters are consistent with Grade I diastolic dysfunction (impaired relaxation). The average left ventricular global longitudinal strain is -19.9 %. The global longitudinal strain is normal.  2. Right ventricular systolic function is normal. The right ventricular size is normal. Tricuspid regurgitation signal is inadequate for assessing PA pressure.  3. Left atrial size was mild to moderately dilated.  4. Right atrial size was mildly dilated.  5. The mitral valve is normal in structure. Trivial mitral valve regurgitation. No evidence of mitral stenosis.  6. The aortic valve is tricuspid. Aortic valve regurgitation is not visualized. No aortic stenosis is present.  7. The inferior vena cava is normal in size with <50% respiratory variability, suggesting right atrial pressure of 8 mmHg. FINDINGS  Left Ventricle: Left ventricular ejection fraction, by estimation, is 70 to 75%. The left ventricle has normal function. The left  ventricle has no regional wall motion abnormalities. Definity contrast agent was given IV to delineate the left ventricular  endocardial borders. The average left ventricular global longitudinal strain is -19.9 %. The global longitudinal strain is normal. The left ventricular internal cavity size was normal in size. There is mild concentric left ventricular hypertrophy. Left ventricular diastolic parameters are consistent with Grade I diastolic dysfunction (impaired relaxation). Normal left ventricular filling pressure. Right Ventricle: The right ventricular size is normal. No increase in right ventricular wall thickness. Right ventricular systolic function is normal. Tricuspid regurgitation signal is inadequate for assessing PA pressure. The tricuspid regurgitant velocity is 2.00 m/s, and with an assumed right atrial pressure of 8 mmHg, the estimated right ventricular systolic pressure is 24.0 mmHg. Left Atrium: Left atrial size was mild to moderately dilated. Right Atrium: Right atrial size was mildly dilated. Pericardium: There is no evidence of pericardial effusion. Mitral Valve: The mitral valve is normal in structure. Trivial mitral valve regurgitation. No evidence of mitral valve stenosis. Tricuspid Valve: The tricuspid valve is normal in structure. Tricuspid valve regurgitation is trivial. No evidence of tricuspid stenosis. Aortic Valve: The aortic valve is tricuspid. Aortic valve regurgitation is not visualized. No aortic stenosis is present. Pulmonic Valve: The pulmonic valve was normal in structure. Pulmonic valve regurgitation is trivial. No evidence of pulmonic stenosis. Aorta: The aortic root and ascending aorta are structurally normal, with no evidence of dilitation. Venous: The inferior vena cava is normal in size with less than 50% respiratory variability, suggesting right atrial pressure of 8 mmHg. IAS/Shunts: No atrial level shunt detected by color flow Doppler.  LEFT VENTRICLE PLAX 2D LVIDd:          4.50 cm   Diastology LVIDs:         2.70 cm   LV e' medial:    9.46 cm/s LV PW:  0.90 cm   LV E/e' medial:  7.5 LV IVS:        0.90 cm   LV e' lateral:   10.70 cm/s LVOT diam:     1.90 cm   LV E/e' lateral: 6.7 LV SV:         61 LV SV Index:   33        2D Longitudinal Strain LVOT Area:     2.84 cm  2D Strain GLS (A2C):   -21.4 %                          2D Strain GLS (A3C):   -16.3 %                          2D Strain GLS (A4C):   -21.9 %                          2D Strain GLS Avg:     -19.9 % RIGHT VENTRICLE RV S prime:     12.40 cm/s TAPSE (M-mode): 2.4 cm LEFT ATRIUM             Index        RIGHT ATRIUM           Index LA diam:        2.70 cm 1.45 cm/m   RA Area:     19.80 cm LA Vol (A2C):   60.9 ml 32.67 ml/m  RA Volume:   55.00 ml  29.50 ml/m LA Vol (A4C):   62.9 ml 33.74 ml/m LA Biplane Vol: 62.3 ml 33.42 ml/m  AORTIC VALVE LVOT Vmax:   110.00 cm/s LVOT Vmean:  69.300 cm/s LVOT VTI:    0.214 m  AORTA Ao Root diam: 2.70 cm Ao Asc diam:  3.20 cm MITRAL VALVE               TRICUSPID VALVE MV Area (PHT): 2.72 cm    TR Peak grad:   16.0 mmHg MV Decel Time: 279 msec    TR Vmax:        200.00 cm/s MV E velocity: 71.30 cm/s MV A velocity: 81.80 cm/s  SHUNTS MV E/A ratio:  0.87        Systemic VTI:  0.21 m                            Systemic Diam: 1.90 cm Chilton Si MD Electronically signed by Chilton Si MD Signature Date/Time: 12/28/2022/12:26:08 PM    Final    CT Angio Chest PE W and/or Wo Contrast  Result Date: 12/27/2022 CLINICAL DATA:  Pulmonary embolism (PE) suspected, high prob EXAM: CT ANGIOGRAPHY CHEST WITH CONTRAST TECHNIQUE: Multidetector CT imaging of the chest was performed using the standard protocol during bolus administration of intravenous contrast. Multiplanar CT image reconstructions and MIPs were obtained to evaluate the vascular anatomy. RADIATION DOSE REDUCTION: This exam was performed according to the departmental dose-optimization program which includes  automated exposure control, adjustment of the mA and/or kV according to patient size and/or use of iterative reconstruction technique. CONTRAST:  80mL OMNIPAQUE IOHEXOL 350 MG/ML SOLN COMPARISON:  Chest x-ray 12/27/2022 FINDINGS: Cardiovascular: Satisfactory opacification of the pulmonary arteries to the segmental level. No evidence of pulmonary embolism. The main pulmonary artery is enlarged measuring up to 3.3 cm. Prominent heart size.  No significant pericardial effusion. The thoracic aorta is normal in caliber. No atherosclerotic plaque of the thoracic aorta. No coronary artery calcifications. Mediastinum/Nodes: No enlarged mediastinal, hilar, or axillary lymph nodes. Thyroid gland, trachea, and esophagus demonstrate no significant findings. Lungs/Pleura: Bilateral lower lobe bronchial wall thickening, right greater than left. Bibasilar atelectasis. No focal consolidation. No pulmonary nodule. No pulmonary mass. No pleural effusion. No pneumothorax. Upper Abdomen: No acute abnormality. Musculoskeletal: No chest wall abnormality. No suspicious lytic or blastic osseous lesions. No acute displaced fracture. Review of the MIP images confirms the above findings. IMPRESSION: 1. No pulmonary embolus. 2. Enlarged main pulmonary artery-correlate for pulmonary hypertension. 3. Bilateral lower lobe bronchial wall thickening suggestive of small airway disease. Electronically Signed   By: Tish Frederickson M.D.   On: 12/27/2022 22:21   DG Chest Port 1 View  Result Date: 12/27/2022 CLINICAL DATA:  Hypotension and shortness of breath EXAM: PORTABLE CHEST 1 VIEW COMPARISON:  None Available. FINDINGS: The heart size and mediastinal contours are within normal limits. Both lungs are clear. The visualized skeletal structures are unremarkable. IMPRESSION: No active disease. Electronically Signed   By: Alcide Clever M.D.   On: 12/27/2022 20:26    Labs: BNP (last 3 results) Recent Labs    12/27/22 2035  BNP 18.0   Basic  Metabolic Panel: Recent Labs  Lab 12/27/22 2035 12/28/22 0115 12/28/22 0414  NA 136 133*  --   K 3.6 4.2  --   CL 100 102  --   CO2 24 20*  --   GLUCOSE 196* 170*  --   BUN 15 12  --   CREATININE 0.92 0.95 0.84  CALCIUM 9.0 8.8*  --   MG  --  1.8  --   PHOS  --  3.4  --    Liver Function Tests: Recent Labs  Lab 12/27/22 2035  AST 23  ALT 19  ALKPHOS 50  BILITOT 0.8  PROT 7.8  ALBUMIN 4.3   No results for input(s): "LIPASE", "AMYLASE" in the last 168 hours. No results for input(s): "AMMONIA" in the last 168 hours. CBC: Recent Labs  Lab 12/27/22 2035 12/28/22 0115  WBC 26.8* 19.2*  NEUTROABS 20.8*  --   HGB 14.5 12.5  HCT 42.6 36.8  MCV 97.9 97.4  PLT 333 279   Cardiac Enzymes: Recent Labs  Lab 12/27/22 2035  CKTOTAL 84   BNP: Invalid input(s): "POCBNP" CBG: Recent Labs  Lab 12/28/22 2014 12/29/22 0003 12/29/22 0417 12/29/22 0753 12/29/22 1158  GLUCAP 128* 125* 113* 93 112*   D-Dimer Recent Labs    12/27/22 2120  DDIMER 2.49*   Hgb A1c Recent Labs    12/28/22 0115  HGBA1C 5.3   Lipid Profile Recent Labs    12/28/22 0115  CHOL 218*  HDL 54  LDLCALC 150*  TRIG 68  CHOLHDL 4.0   Thyroid function studies No results for input(s): "TSH", "T4TOTAL", "T3FREE", "THYROIDAB" in the last 72 hours.  Invalid input(s): "FREET3" Anemia work up No results for input(s): "VITAMINB12", "FOLATE", "FERRITIN", "TIBC", "IRON", "RETICCTPCT" in the last 72 hours. Urinalysis    Component Value Date/Time   COLORURINE YELLOW 12/29/2022 0622   APPEARANCEUR HAZY (A) 12/29/2022 0622   LABSPEC 1.032 (H) 12/29/2022 0622   PHURINE 5.0 12/29/2022 0622   GLUCOSEU NEGATIVE 12/29/2022 0622   HGBUR NEGATIVE 12/29/2022 0622   BILIRUBINUR NEGATIVE 12/29/2022 0622   KETONESUR NEGATIVE 12/29/2022 0622   PROTEINUR NEGATIVE 12/29/2022 0622   NITRITE NEGATIVE 12/29/2022 0622  LEUKOCYTESUR NEGATIVE 12/29/2022 0622   Sepsis Labs Recent Labs  Lab 12/27/22 2035  12/28/22 0115  WBC 26.8* 19.2*   Microbiology Recent Results (from the past 240 hour(s))  SARS Coronavirus 2 by RT PCR (hospital order, performed in [facility] hospital lab) *cepheid single result test* Anterior Nasal Swab     Status: None   Collection Time: 12/28/22  1:16 AM   Specimen: Anterior Nasal Swab  Result Value Ref Range Status   SARS Coronavirus 2 by RT PCR NEGATIVE NEGATIVE Final    Comment: (NOTE) SARS-CoV-2 target nucleic acids are NOT DETECTED.  The SARS-CoV-2 RNA is generally detectable in upper and lower respiratory specimens during the acute phase of infection. The lowest concentration of SARS-CoV-2 viral copies this assay can detect is 250 copies / mL. A negative result does not preclude SARS-CoV-2 infection and should not be used as the sole basis for treatment or other patient management decisions.  A negative result may occur with improper specimen collection / handling, submission of specimen other than nasopharyngeal swab, presence of viral mutation(s) within the areas targeted by this assay, and inadequate number of viral copies (<250 copies / mL). A negative result must be combined with clinical observations, patient history, and epidemiological information.  Fact Sheet for Patients:   RoadLapTop.co.za  Fact Sheet for Healthcare Providers: http://kim-miller.com/  This test is not yet approved or  cleared by the [place] FDA and has been authorized for detection and/or diagnosis of SARS-CoV-2 by FDA under an Emergency Use Authorization (EUA).  This EUA will remain in effect (meaning this test can be used) for the duration of the COVID-19 declaration under Section 564(b)(1) of the Act, 21 U.S.C. section 360bbb-3(b)(1), unless the authorization is terminated or revoked sooner.  Performed at [facility], [address] [place], [place] [address]   Respiratory (~20 pathogens) panel  by PCR     Status: None   Collection Time: 12/28/22  1:17 AM   Specimen: Anterior Nasal Swab; Respiratory  Result Value Ref Range Status   Adenovirus NOT DETECTED NOT DETECTED Final   Coronavirus 229E NOT DETECTED NOT DETECTED Final    Comment: (NOTE) The Coronavirus on the Respiratory Panel, DOES NOT test for the novel  Coronavirus (2019 nCoV)    Coronavirus HKU1 NOT DETECTED NOT DETECTED Final   Coronavirus NL63 NOT DETECTED NOT DETECTED Final   Coronavirus OC43 NOT DETECTED NOT DETECTED Final   Metapneumovirus NOT DETECTED NOT DETECTED Final   Rhinovirus / Enterovirus NOT DETECTED NOT DETECTED Final   Influenza A NOT DETECTED NOT DETECTED Final   Influenza B NOT DETECTED NOT DETECTED Final   Parainfluenza Virus 1 NOT DETECTED NOT DETECTED Final   Parainfluenza Virus 2 NOT DETECTED NOT DETECTED Final   Parainfluenza Virus 3 NOT DETECTED NOT DETECTED Final   Parainfluenza Virus 4 NOT DETECTED NOT DETECTED Final   Respiratory Syncytial Virus NOT DETECTED NOT DETECTED Final   Bordetella pertussis NOT DETECTED NOT DETECTED Final   Bordetella Parapertussis NOT DETECTED NOT DETECTED Final   Chlamydophila pneumoniae NOT DETECTED NOT DETECTED Final   Mycoplasma pneumoniae NOT DETECTED NOT DETECTED Final    Comment: Performed at [facility], [address] [place], [place] [address]  Culture, blood (Routine X 2) w Reflex to ID Panel     Status: None (Preliminary result)   Collection Time: 12/28/22  4:30 AM   Specimen: BLOOD  Result Value Ref Range Status   Specimen Description BLOOD BLOOD LEFT  ARM  Final   Special Requests   Final    BOTTLES DRAWN AEROBIC AND ANAEROBIC Blood Culture results may not be optimal due to an excessive volume of blood received in culture bottles   Culture   Final    NO GROWTH 1 DAY Performed at [facility], [address], [place], [place] [address]    Report Status PENDING  Incomplete  Culture, blood (Routine X 2) w Reflex to ID Panel      Status: None (Preliminary result)   Collection Time: 12/28/22  4:30 AM   Specimen: BLOOD  Result Value Ref Range Status   Specimen Description BLOOD BLOOD RIGHT ARM  Final   Special Requests   Final    BOTTLES DRAWN AEROBIC AND ANAEROBIC Blood Culture results may not be optimal due to an excessive volume of blood received in culture bottles   Culture   Final    NO GROWTH 1 DAY Performed at [facility], [address] [place], [place] [address]    Report Status PENDING  Incomplete     Time coordinating discharge: 25 minutes  SIGNED: Lanae Boast, MD  [facility] 12/29/2022, 2:46 PM  If 7PM-7AM, please contact night-coverage [email or web address]

## 2022-12-30 LAB — CULTURE, BLOOD (ROUTINE X 2): Culture: NO GROWTH

## 2022-12-31 ENCOUNTER — Encounter (HOSPITAL_BASED_OUTPATIENT_CLINIC_OR_DEPARTMENT_OTHER): Payer: Self-pay | Admitting: Obstetrics and Gynecology

## 2023-01-01 LAB — CULTURE, BLOOD (ROUTINE X 2)

## 2023-01-02 LAB — CULTURE, BLOOD (ROUTINE X 2): Culture: NO GROWTH

## 2023-01-10 ENCOUNTER — Institutional Professional Consult (permissible substitution) (HOSPITAL_BASED_OUTPATIENT_CLINIC_OR_DEPARTMENT_OTHER): Payer: BC Managed Care – PPO | Admitting: Pulmonary Disease

## 2023-01-15 NOTE — Progress Notes (Unsigned)
Cardiology Clinic Note   Date: 01/16/2023 ID: [name], DOB 09-14-1980, MRN [id number]  Primary Cardiologist:  Thurmon Fair, MD  Patient Profile    Brenda Barker is a 42 y.o. female who presents to the clinic today for hospital follow up.     Past medical history significant for: Nonobstructive CAD. Echo 12/28/2022: EF 70 to 75%.  Mild concentric LVH.  Grade I DD.  Normal RV function.  Moderate LAE.  Mild RAE.  No significant valvular abnormalities. Coronary CTA 12/29/2022: Coronary calcium score of 0.  Minimal stenosis (0 to 24%) with noncalcified plaque in proximal RCA. Preeclampsia. Seizures. Last seizure age 36. Hyperlipidemia. Lipid panel 12/28/2022: LDL 150, HDL 54, TG 68, total 218.     History of Present Illness    Brenda Barker was first evaluated by Dr. Royann Shivers on 12/28/2022 chest pain and elevated troponin during hospital admission.  Patient presented to the ED via EMS from [facility] for shortness of breath and hypotension.  Initial BP 90/50 improved to 104/64 after fluids.  Patient reported while working out with a friend she had sudden onset of shortness of breath.  Upon EMS arrival she was noted to be hypotensive and was provided with IV fluids and Zofran.  Upon arrival to ED patient was noted to have broken out in hives.  Patient received 1 dose of IM epi along with IV Pepcid, Benadryl, Decadron.  Troponin 19>> 42>> 88.  Elevated D-dimer at 2.49.  CTA chest showed no PE, enlarged main pulmonary artery concerning for pulmonary hypertension, and bilateral lobe bronchial wall thickening suggestive of small airway disease.  Patient was admitted for further workup.  Patient reported to Dr. Royann Shivers continued chest pain reproducible with deep palpation and intercostal space in the right anterior chest.  Her main symptom at the time of her episode of shortness of breath.  Chest pain was suspected to be musculoskeletal in nature and elevated troponin likely related to  sudden onset of severe shortness of breath.  Would consider outpatient coronary CTA if chest pain continues.  Also reported 1 week history of difficulty sleeping with wheezing at night.  She is not a smoker but does occasionally smoke marijuana.  Pulmonology was consulted for PFTs.  Underwent coronary CTA prior to discharge showing a calcium score of 0 and minimal noncalcified plaque in proximal RCA.  Today, patient is here alone. Discussed testing performed in the hospital. Patient denies any further shortness of breath or chest pain. She has returned to the gym but has only been walking. She reports over the past 6-7 months of dietary indiscretion. She follows a low sugar diet but was eating an increased amount of fried foods and processed meat. She has since decreased those items in her diet and is back to a healthy diet. She has not started cholesterol medication yet as she wanted to discuss it first. She would like to work on her diet for a few weeks and see if her cholesterol starts to come down. I think this is reasonable. She will come back in 10-12 weeks to have lipids repeated. She unfortunately missed her pulmonology appointment because she got the date wrong. She will call to get it rescheduled. She will also get set up with a PCP.    ROS: All other systems reviewed and are otherwise negative except as noted in History of Present Illness.  Studies Reviewed       ECG is not ordered today.  Physical Exam    VS:  BP 122/72   Pulse (!) 57   Ht 5\' 6"  (1.676 m)   Wt 174 lb 6.4 oz (79.1 kg)   LMP  (LMP Unknown)   SpO2 97%   BMI 28.15 kg/m  , BMI Body mass index is 28.15 kg/m.  GEN: Well nourished, well developed, in no acute distress. Neck: No JVD or carotid bruits. Cardiac:  RRR. No murmurs. No rubs or gallops.   Respiratory:  Respirations regular and unlabored. Clear to auscultation without rales, wheezing or rhonchi. GI: Soft, nontender, nondistended. Extremities:  Radials/DP/PT 2+ and equal bilaterally. No clubbing or cyanosis. No edema.  Skin: Warm and dry, no rash. Neuro: Strength intact.  Assessment & Plan    Nonobstructive CAD.  Echo May 2024 showed normal LV/RV function, mild LVH, Grade I DD, BAE.  Coronary CTA June 2024 with calcium score of 0, minimal noncalcified plaque proximal RCA.  Patient denies any further chest pain. She is back at the gym walking. She is encouraged to increase physical activity as tolerated.  Shortness of breath. CTA chest May 2024 showed no PE, enlarged main pulmonary artery concerning for pulmonary hypertension, and bilateral lobe bronchial wall thickening suggestive of small airway disease. No further shortness of breath since hospital discharge. She missed her pulmonary appointment but will call to reschedule.  Hyperlipidemia.  LDL May 2024 150, not at goal.  She would like to continue dietary changes and see if her cholesterol improves. I think this is reasonable. She is willing to start rosuvastatin if she needs to based on recheck.  Recheck lipids in 10-12 weeks.  Disposition: Lipid panel 10-12 weeks. Provided with PCP list. Return in 1 year or sooner as needed.          Signed, Etta Grandchild. Omair Dettmer, DNP, NP-C

## 2023-01-16 ENCOUNTER — Ambulatory Visit: Payer: BC Managed Care – PPO | Attending: Student | Admitting: Student

## 2023-01-16 ENCOUNTER — Encounter: Payer: Self-pay | Admitting: Student

## 2023-01-16 VITALS — BP 122/72 | HR 57 | Ht 66.0 in | Wt 174.4 lb

## 2023-01-16 DIAGNOSIS — Z79899 Other long term (current) drug therapy: Secondary | ICD-10-CM

## 2023-01-16 DIAGNOSIS — R0602 Shortness of breath: Secondary | ICD-10-CM | POA: Diagnosis not present

## 2023-01-16 DIAGNOSIS — I2510 Atherosclerotic heart disease of native coronary artery without angina pectoris: Secondary | ICD-10-CM | POA: Diagnosis not present

## 2023-01-16 DIAGNOSIS — E785 Hyperlipidemia, unspecified: Secondary | ICD-10-CM

## 2023-01-16 NOTE — Patient Instructions (Addendum)
Medication Instructions:  Your physician recommends that you continue on your current medications as directed. Please refer to the Current Medication list given to you today.  *If you need a refill on your cardiac medications before your next appointment, please call your pharmacy*   Lab Work: Your physician recommends that you return for lab work in 8-10 weeks: Lipids - Aug 12-30 If you have labs (blood work) drawn today and your tests are completely normal, you will receive your results only by: MyChart Message (if you have MyChart) OR A paper copy in the mail If you have any lab test that is abnormal or we need to change your treatment, we will call you to review the results.   Testing/Procedures: None   Follow-Up: At [facility], you and your health needs are our priority.  As part of our continuing mission to provide you with exceptional heart care, we have created designated Provider Care Teams.  These Care Teams include your primary Cardiologist (physician) and Advanced Practice Providers (APPs -  Physician Assistants and Nurse Practitioners) who all work together to provide you with the care you need, when you need it.  We recommend signing up for the patient portal called "MyChart".  Sign up information is provided on this After Visit Summary.  MyChart is used to connect with patients for Virtual Visits (Telemedicine).  Patients are able to view lab/test results, encounter notes, upcoming appointments, etc.  Non-urgent messages can be sent to your provider as well.   To learn more about what you can do with MyChart, go to [email or web address].    Your next appointment:   1 year(s)  Provider:   Thurmon Fair, MD

## 2023-02-07 ENCOUNTER — Ambulatory Visit (HOSPITAL_BASED_OUTPATIENT_CLINIC_OR_DEPARTMENT_OTHER): Payer: BC Managed Care – PPO | Admitting: Pulmonary Disease

## 2023-02-07 ENCOUNTER — Encounter (HOSPITAL_BASED_OUTPATIENT_CLINIC_OR_DEPARTMENT_OTHER): Payer: Self-pay | Admitting: Pulmonary Disease

## 2023-02-07 ENCOUNTER — Other Ambulatory Visit (HOSPITAL_COMMUNITY): Payer: Self-pay

## 2023-02-07 VITALS — BP 126/80 | HR 63 | Temp 98.4°F | Ht 66.5 in | Wt 174.6 lb

## 2023-02-07 DIAGNOSIS — J45990 Exercise induced bronchospasm: Secondary | ICD-10-CM

## 2023-02-07 NOTE — Patient Instructions (Signed)
  Exercise induced asthma --CONTINUE Albuterol AS NEEDED for shortness of breath and wheezing --If symptoms are more persistent, consider adding ICS/LABA.  --ORDER pulmonary function tests in 6 months

## 2023-02-07 NOTE — Progress Notes (Signed)
Subjective:   PATIENT ID: Brenda Barker GENDER: female DOB: [birth year], MRN: [id number]  Chief Complaint  Patient presents with   Consult    Consult. Patient is here for SOB.     Reason for Visit: New consult for asthma  Brenda Barker is a 42 year old female never smoker with seasonal allergies and HTN, HLD who presents for evaluation for asthma.  She was recently hospitalized from 5/30-6/1 after presenting for chest pain and dyspnea after running on the treadmill. Cardiology work-up negative. Arranged for pulmonary follow-up for suspected asthma. Since then she has some chest tightness with walking upstairs. Occasional minimal wheezing while laying down. Denies nocturnal symptoms. Denies recent sick contacts. She has used albuterol and reports it does open her up.   She reports mom and cousins have asthma. Denies childhood asthma. Denies frequent bronchitis episodes. Rarely has respiratory illness.   Social History: Never smoker Desk job: fundraising  I have personally reviewed patient's past medical/family/social history, allergies, current medications.  Past Medical History:  Diagnosis Date   Allergy    DDD (degenerative disc disease), cervical 2018   Early   Hypertension    pregnancy induced -PRE-ECLAMPSIA (Pt is not on medication)   Seizures (HCC)    Last seizures was when patient was 16   Uterine fibroid 2019     Family History  Problem Relation Age of Onset   Asthma Mother      Social History   Occupational History   Occupation: Nurse, mental health: [employer]  Tobacco Use   Smoking status: Never   Smokeless tobacco: Never  Vaping Use   Vaping status: Never Used  Substance and Sexual Activity   Alcohol use: Yes    Alcohol/week: 1.0 standard drink of alcohol    Types: 1 Glasses of wine per week    Comment: occ.   Drug use: No   Sexual activity: Yes    Birth control/protection: Inserts    Comment: nuva ring    Allergies  Allergen  Reactions   Zofran [Ondansetron] Hives, Shortness Of Breath, Rash and Other (See Comments)    Skin burning, also (had to receive an EpiPen to reverse the allergic reaction)     Outpatient Medications Prior to Visit  Medication Sig Dispense Refill   albuterol (VENTOLIN HFA) 108 (90 Base) MCG/ACT inhaler Inhale 2 puffs into the lungs every 6 (six) hours as needed for wheezing or shortness of breath. 8 g 0   CLARITIN 10 MG tablet Take 10 mg by mouth daily.     TYLENOL 500 MG tablet Take 500-1,000 mg by mouth every 6 (six) hours as needed for mild pain or headache.     rosuvastatin (CRESTOR) 10 MG tablet Take 1 tablet (10 mg total) by mouth daily. 30 tablet 0   ibuprofen (ADVIL) 800 MG tablet Take 1 tablet (800 mg total) by mouth every 8 (eight) hours as needed. 30 tablet 5   No facility-administered medications prior to visit.    Review of Systems  Constitutional:  Negative for chills, diaphoresis, fever, malaise/fatigue and weight loss.  HENT:  Negative for congestion.   Respiratory:  Negative for cough, hemoptysis, sputum production, shortness of breath and wheezing.   Cardiovascular:  Negative for chest pain (chest tightness), palpitations and leg swelling.     Objective:   Vitals:   02/07/23 0944  BP: 126/80  Pulse: 63  Temp: 98.4 F (36.9 C)  TempSrc: Oral  SpO2: 92%  Weight: 174 lb 9.6 oz (79.2 kg)  Height: 5' 6.5" (1.689 m)   SpO2: 92 % O2 Device: None (Room air) Wearing nails  Physical Exam: General: Well-appearing, no acute distress HENT: , AT Eyes: EOMI, no scleral icterus Respiratory: Clear to auscultation bilaterally.  No crackles, wheezing or rales Cardiovascular: RRR, -M/R/G, no JVD Extremities:-Edema,-tenderness Neuro: AAO x4, CNII-XII grossly intact Psych: Normal mood, normal affect  Data Reviewed:  Imaging: CTA 12/27/22 - No PE, bilateral lower lobe bronchial wall thickening  PFT: None on file  Labs: CBC    Component Value Date/Time   WBC  19.2 (H) 12/28/2022 0115   RBC 3.78 (L) 12/28/2022 0115   HGB 12.5 12/28/2022 0115   HCT 36.8 12/28/2022 0115   PLT 279 12/28/2022 0115   MCV 97.4 12/28/2022 0115   MCV 102.1 (A) 07/06/2013 1850   MCH 33.1 12/28/2022 0115   MCHC 34.0 12/28/2022 0115   RDW 11.8 12/28/2022 0115   LYMPHSABS 3.9 12/27/2022 2035   MONOABS 1.5 (H) 12/27/2022 2035   EOSABS 0.2 12/27/2022 2035   BASOSABS 0.1 12/27/2022 2035        Assessment & Plan:   Discussion: 42 year old female never smoker with seasonal allergies and HTN, HLD who presents for evaluation for asthma. Exercise induced. Improved on albuterol but not consistently using. Discussed clinical course and management of asthma including bronchodilator regimen. Advised that if she had increased symptoms can step up to ICS/LABA (generic symbicort $5).   Exercise induced asthma --CONTINUE Albuterol AS NEEDED for shortness of breath and wheezing --If symptoms are more persistent, consider adding ICS/LABA.  --ORDER pulmonary function tests in 6 months   Health Maintenance  There is no immunization history on file for this patient. CT Lung Screen - not qualified  No orders of the defined types were placed in this encounter. No orders of the defined types were placed in this encounter.   Return in about 6 months (around 08/10/2023) for after PFT.  I have spent a total time of 30-minutes on the day of the appointment reviewing prior documentation, coordinating care and discussing medical diagnosis and plan with the patient/family. Imaging, labs and tests included in this note have been reviewed and interpreted independently by me.  Viliami Bracco Mechele Collin, MD [facility] 02/07/2023 10:22 AM  Office Number [phone]
